# Patient Record
Sex: Female | Born: 1990 | Race: Black or African American | Hispanic: No | Marital: Married | Smoking: Never smoker
Health system: Southern US, Community
[De-identification: ages and names within clinical notes are randomized; demographics above are authoritative.]

## PROBLEM LIST (undated history)

## (undated) DIAGNOSIS — Z789 Other specified health status: Secondary | ICD-10-CM

---

## 2017-05-17 NOTE — L&D Delivery Note (Signed)
Delivery Note  First Stage: Labor onset: 0600 Augmentation : [facility] /[facility] intrapartum: Nubain 10mg  [facility] 1500 after # 2 dose ABX (4-5 cm/ BBOW / show)  Second Stage: Complete dilation at 0522 Onset of pushing at 0522 FHR second stage variable to 80-90 from 120-130 baseline  Delivery of a viable female at [id number] by CNM in LOA position Loose  nuchal cord x 1 Cord double clamped after cessation of pulsation, cut by FOB Cord blood sample collected   Third Stage: [facility] intact with 3 VC @ 0532 Placenta disposition: hospital disposal Uterine tone firm / bleeding scant  no laceration identified  Est. Blood Loss (mL): 50  Complications: none  Mom to postpartum.  Baby to Couplet care / Skin to Skin.  Newborn: Birth Weight: 6-3  Apgar Scores: 1-minute: 8                           5-minute: 9   Feeding planned: breast  Ruth Allen CNM, MSN, [facility] 12/06/2017, 5:43 AM

## 2017-05-26 LAB — OB RESULTS CONSOLE ABO/RH: RH Type: POSITIVE

## 2017-05-26 LAB — OB RESULTS CONSOLE RUBELLA ANTIBODY, IGM: Rubella: IMMUNE

## 2017-05-26 LAB — OB RESULTS CONSOLE GC/CHLAMYDIA
CHLAMYDIA, DNA PROBE: NEGATIVE
Gonorrhea: NEGATIVE

## 2017-05-26 LAB — OB RESULTS CONSOLE HEPATITIS B SURFACE ANTIGEN: HEP B S AG: NEGATIVE

## 2017-05-26 LAB — OB RESULTS CONSOLE ANTIBODY SCREEN: ANTIBODY SCREEN: NEGATIVE

## 2017-05-26 LAB — OB RESULTS CONSOLE HIV ANTIBODY (ROUTINE TESTING): HIV: NONREACTIVE

## 2017-05-26 LAB — OB RESULTS CONSOLE RPR: RPR: NONREACTIVE

## 2017-06-06 ENCOUNTER — Other Ambulatory Visit (HOSPITAL_COMMUNITY): Payer: Self-pay | Admitting: Certified Nurse Midwife

## 2017-06-06 DIAGNOSIS — Z3A19 19 weeks gestation of pregnancy: Secondary | ICD-10-CM

## 2017-06-06 DIAGNOSIS — Z3689 Encounter for other specified antenatal screening: Secondary | ICD-10-CM

## 2017-07-11 ENCOUNTER — Encounter (HOSPITAL_COMMUNITY): Payer: Self-pay | Admitting: Certified Nurse Midwife

## 2017-07-15 ENCOUNTER — Ambulatory Visit (HOSPITAL_COMMUNITY)
Admission: RE | Admit: 2017-07-15 | Discharge: 2017-07-15 | Disposition: A | Discharge status: Home / Self Care | Payer: Medicaid Other | Source: Ambulatory Visit | Attending: Certified Nurse Midwife | Admitting: Certified Nurse Midwife

## 2017-07-15 DIAGNOSIS — Z3689 Encounter for other specified antenatal screening: Secondary | ICD-10-CM

## 2017-07-15 DIAGNOSIS — Z3A19 19 weeks gestation of pregnancy: Secondary | ICD-10-CM | POA: Diagnosis not present

## 2017-07-21 ENCOUNTER — Other Ambulatory Visit (HOSPITAL_COMMUNITY): Payer: Self-pay

## 2017-07-21 DIAGNOSIS — Z362 Encounter for other antenatal screening follow-up: Secondary | ICD-10-CM

## 2017-07-21 DIAGNOSIS — Z3A23 23 weeks gestation of pregnancy: Secondary | ICD-10-CM

## 2017-08-12 ENCOUNTER — Ambulatory Visit (HOSPITAL_COMMUNITY): Payer: Medicaid Other

## 2017-08-17 ENCOUNTER — Ambulatory Visit (HOSPITAL_COMMUNITY): Payer: Medicaid Other

## 2017-08-18 ENCOUNTER — Ambulatory Visit (HOSPITAL_COMMUNITY)
Admission: RE | Admit: 2017-08-18 | Discharge: 2017-08-18 | Disposition: A | Discharge status: Home / Self Care | Payer: Medicaid Other | Source: Ambulatory Visit

## 2017-08-18 DIAGNOSIS — Z362 Encounter for other antenatal screening follow-up: Secondary | ICD-10-CM | POA: Diagnosis present

## 2017-08-18 DIAGNOSIS — Z3A23 23 weeks gestation of pregnancy: Secondary | ICD-10-CM

## 2017-08-19 ENCOUNTER — Other Ambulatory Visit: Payer: Self-pay

## 2017-08-19 ENCOUNTER — Emergency Department (HOSPITAL_COMMUNITY)
Admission: EM | Admit: 2017-08-19 | Discharge: 2017-08-19 | Disposition: A | Discharge status: Home / Self Care | Payer: Medicaid Other | Attending: Emergency Medicine | Admitting: Emergency Medicine

## 2017-08-19 ENCOUNTER — Encounter (HOSPITAL_COMMUNITY): Payer: Self-pay | Admitting: *Deleted

## 2017-08-19 DIAGNOSIS — K047 Periapical abscess without sinus: Secondary | ICD-10-CM

## 2017-08-19 DIAGNOSIS — K029 Dental caries, unspecified: Secondary | ICD-10-CM | POA: Diagnosis not present

## 2017-08-19 DIAGNOSIS — K0889 Other specified disorders of teeth and supporting structures: Secondary | ICD-10-CM | POA: Diagnosis present

## 2017-08-19 MED ORDER — HYDROCODONE-ACETAMINOPHEN 5-325 MG PO TABS
1.0000 | ORAL_TABLET | Freq: Once | ORAL | Status: AC
  Administered 2017-08-19: 1 via ORAL
  Filled 2017-08-19: qty 1

## 2017-08-19 MED ORDER — AMOXICILLIN 500 MG PO CAPS: 500.0000 mg | ORAL_CAPSULE | Freq: Three times a day (TID) | ORAL | 0 refills | Status: DC

## 2017-08-19 MED ORDER — AMOXICILLIN 500 MG PO CAPS
500.0000 mg | ORAL_CAPSULE | Freq: Once | ORAL | Status: AC
  Administered 2017-08-19: 500 mg via ORAL
  Filled 2017-08-19: qty 1

## 2017-08-19 NOTE — Discharge Instructions (Addendum)
Call Dr. Olen PelHalligan's office to schedule follow up as soon as possible. Tell them you were seen in the ED and referred.

## 2017-08-19 NOTE — ED Provider Notes (Signed)
[facility] Provider Note   CSN: [id number] Arrival date & time: 08/19/17  [id number]     History   Chief Complaint Chief Complaint  Patient presents with  . Dental Pain    HPI Ruth Allen is a 27 y.o. female G2P1 @ 2452w6d gestation who presents to the ED with dental pain. The pain is located in the left lower dental area and has gotten worse over the past 2 days. Patient has no problems with her pregnancy and is getting prenatal care.  HPI  History reviewed. No pertinent past medical history.  There are no active problems to display for this patient.   History reviewed. No pertinent surgical history.   OB History    Gravida  1   Para      Term      Preterm      AB      Living        SAB      TAB      Ectopic      Multiple      Live Births               Home Medications    Prior to Admission medications   Medication Sig Start Date End Date Taking? Authorizing Provider  amoxicillin (AMOXIL) 500 MG capsule Take 1 capsule (500 mg total) by mouth 3 (three) times daily. 08/19/17   Janne NapoleonNeese, Pessy Delamar M, NP    Family History No family history on file.  Social History Social History   Tobacco Use  . Smoking status: Never Smoker  . Smokeless tobacco: Never Used  Substance Use Topics  . Alcohol use: Never    Frequency: Never  . Drug use: Never     Allergies   Patient has no known allergies.   Review of Systems Review of Systems  Constitutional: Negative for chills and fever.  HENT: Positive for dental problem.   Respiratory: Negative for cough and shortness of breath.   Gastrointestinal: Negative for abdominal pain.  Genitourinary: Negative for vaginal bleeding and vaginal discharge.       23 weeks 6 days gestation  Musculoskeletal: Negative for arthralgias.  Skin: Negative for rash.  Neurological: Negative for headaches.  Psychiatric/Behavioral: Negative for confusion.     Physical Exam Updated Vital  Signs BP 111/72 (BP Location: Right Arm)   Pulse 80   Temp 98.3 F (36.8 C) (Oral)   Resp 18   LMP 03/02/2017 (Approximate)   SpO2 100%   Physical Exam  Constitutional: She is oriented to person, place, and time. She appears well-developed and well-nourished. No distress.  HENT:  Head: Normocephalic and atraumatic.  Mouth/Throat: Uvula is midline and oropharynx is clear and moist. Dental caries present.    Gum surrounding the left lower molars with erythema  Eyes: EOM are normal.  Neck: Neck supple.  Cardiovascular: Normal rate.  Pulmonary/Chest: Effort normal.  Abdominal: Soft. There is no tenderness.  Gravid c/w dates, positive FHT's by doppler  Musculoskeletal: Normal range of motion.  Neurological: She is alert and oriented to person, place, and time. No cranial nerve deficit.  Skin: Skin is warm and dry.  Psychiatric: She has a normal mood and affect. Her behavior is normal.  Nursing note and vitals reviewed.    ED Treatments / Results  Labs (all labs ordered are listed, but only abnormal results are displayed) Labs Reviewed - No data to display Procedures Procedures (including critical care time)  Medications Ordered  in ED Medications  amoxicillin (AMOXIL) capsule 500 mg (has no administration in time range)  HYDROcodone-acetaminophen (NORCO/VICODIN) 5-325 MG per tablet 1 tablet (has no administration in time range)     Initial Impression / Assessment and Plan / ED Course  I have reviewed the triage vital signs and the nursing notes. Patient with toothache in pregnancy @ 24.6 weeks  No gross abscess.  Exam unconcerning for Ludwig's angina or spread of infection.  Will treat with Amoxicillin and tylenol.  Urged patient to follow-up with dentist. Referral given.   Final Clinical Impressions(s) / ED Diagnoses   Final diagnoses:  Infected dental caries    ED Discharge Orders        Ordered    amoxicillin (AMOXIL) 500 MG capsule  3 times daily     08/19/17  1024       Damian Leavell [place], [place] 08/19/17 1043    Tegeler, Canary Brim, MD 08/19/17 1840

## 2017-08-19 NOTE — ED Triage Notes (Signed)
Pt is here with left lower molar tooth pain that started 2 days ago.  Pt is currently [redacted] weeks pregnant and has no complaints of abdominal pain, vaginal bleeding or concern about pregnancy.  G-2 P-1 A-0.  She just wants to make sure the tooth pain does not go to baby

## 2017-12-06 ENCOUNTER — Inpatient Hospital Stay (HOSPITAL_COMMUNITY)
Admission: AD | Admit: 2017-12-06 | Discharge: 2017-12-07 | DRG: 807 | Disposition: A | Discharge status: Home / Self Care | Payer: Medicaid Other | Attending: Obstetrics and Gynecology | Admitting: Obstetrics and Gynecology

## 2017-12-06 ENCOUNTER — Encounter (HOSPITAL_COMMUNITY): Payer: Self-pay

## 2017-12-06 DIAGNOSIS — Z3A39 39 weeks gestation of pregnancy: Secondary | ICD-10-CM | POA: Diagnosis not present

## 2017-12-06 DIAGNOSIS — Z3483 Encounter for supervision of other normal pregnancy, third trimester: Secondary | ICD-10-CM | POA: Diagnosis present

## 2017-12-06 DIAGNOSIS — O99824 Streptococcus B carrier state complicating childbirth: Principal | ICD-10-CM | POA: Diagnosis present

## 2017-12-06 HISTORY — DX: Other specified health status: Z78.9

## 2017-12-06 LAB — CBC
HCT: 34.5 % — ABNORMAL LOW (ref 36.0–46.0)
Hemoglobin: 12.4 g/dL (ref 12.0–15.0)
MCH: 33.9 pg (ref 26.0–34.0)
MCHC: 35.9 g/dL (ref 30.0–36.0)
MCV: 94.3 fL (ref 78.0–100.0)
Platelets: 197 10*3/uL (ref 150–400)
RBC: 3.66 MIL/uL — ABNORMAL LOW (ref 3.87–5.11)
RDW: 13 % (ref 11.5–15.5)
WBC: 9.1 10*3/uL (ref 4.0–10.5)

## 2017-12-06 MED ORDER — EPHEDRINE 5 MG/ML INJ
10.0000 mg | INTRAVENOUS | Status: DC | PRN
  Filled 2017-12-06: qty 2

## 2017-12-06 MED ORDER — DIBUCAINE 1 % RE OINT: 1.0000 "application " | TOPICAL_OINTMENT | RECTAL | Status: DC | PRN

## 2017-12-06 MED ORDER — OXYCODONE-ACETAMINOPHEN 5-325 MG PO TABS: 1.0000 | ORAL_TABLET | ORAL | Status: DC | PRN

## 2017-12-06 MED ORDER — ACETAMINOPHEN 325 MG PO TABS: 650.0000 mg | ORAL_TABLET | ORAL | Status: DC | PRN

## 2017-12-06 MED ORDER — OXYTOCIN 40 UNITS IN LACTATED RINGERS INFUSION - SIMPLE MED
1.0000 m[IU]/min | INTRAVENOUS | Status: DC
  Administered 2017-12-06: 2 m[IU]/min via INTRAVENOUS
  Filled 2017-12-06: qty 1000

## 2017-12-06 MED ORDER — COCONUT OIL OIL: 1.0000 "application " | TOPICAL_OIL | Status: DC | PRN

## 2017-12-06 MED ORDER — SOD CITRATE-CITRIC ACID 500-334 MG/5ML PO SOLN: 30.0000 mL | ORAL | Status: DC | PRN

## 2017-12-06 MED ORDER — PHENYLEPHRINE 40 MCG/ML (10ML) SYRINGE FOR IV PUSH (FOR BLOOD PRESSURE SUPPORT)
80.0000 ug | PREFILLED_SYRINGE | INTRAVENOUS | Status: DC | PRN
Start: 2017-12-06 — End: 2017-12-06
  Filled 2017-12-06: qty 5

## 2017-12-06 MED ORDER — OXYTOCIN BOLUS FROM INFUSION
500.0000 mL | Freq: Once | INTRAVENOUS | Status: AC
  Administered 2017-12-06: 500 mL via INTRAVENOUS

## 2017-12-06 MED ORDER — NALBUPHINE HCL 10 MG/ML IJ SOLN
10.0000 mg | INTRAMUSCULAR | Status: DC | PRN
  Administered 2017-12-06: 10 mg via INTRAVENOUS
  Filled 2017-12-06: qty 1

## 2017-12-06 MED ORDER — ACETAMINOPHEN 325 MG PO TABS
650.0000 mg | ORAL_TABLET | ORAL | Status: DC | PRN
  Administered 2017-12-06: 650 mg via ORAL
  Filled 2017-12-06: qty 2

## 2017-12-06 MED ORDER — TERBUTALINE SULFATE 1 MG/ML IJ SOLN
0.2500 mg | Freq: Once | INTRAMUSCULAR | Status: DC | PRN
  Filled 2017-12-06: qty 1

## 2017-12-06 MED ORDER — LACTATED RINGERS IV SOLN
INTRAVENOUS | Status: DC
  Administered 2017-12-06: 04:00:00 via INTRAVENOUS

## 2017-12-06 MED ORDER — DIPHENHYDRAMINE HCL 50 MG/ML IJ SOLN: 12.5000 mg | INTRAMUSCULAR | Status: DC | PRN

## 2017-12-06 MED ORDER — WITCH HAZEL-GLYCERIN EX PADS: 1.0000 "application " | MEDICATED_PAD | CUTANEOUS | Status: DC | PRN

## 2017-12-06 MED ORDER — LACTATED RINGERS IV SOLN: 500.0000 mL | Freq: Once | INTRAVENOUS | Status: DC

## 2017-12-06 MED ORDER — LIDOCAINE HCL (PF) 1 % IJ SOLN
30.0000 mL | INTRAMUSCULAR | Status: DC | PRN
  Filled 2017-12-06: qty 30

## 2017-12-06 MED ORDER — IBUPROFEN 600 MG PO TABS
600.0000 mg | ORAL_TABLET | Freq: Four times a day (QID) | ORAL | Status: DC
  Administered 2017-12-06 – 2017-12-07 (×5): 600 mg via ORAL
  Filled 2017-12-06 (×6): qty 1

## 2017-12-06 MED ORDER — BENZOCAINE-MENTHOL 20-0.5 % EX AERO
1.0000 "application " | INHALATION_SPRAY | CUTANEOUS | Status: DC | PRN
  Administered 2017-12-06: 1 via TOPICAL
  Filled 2017-12-06: qty 56

## 2017-12-06 MED ORDER — FENTANYL 2.5 MCG/ML BUPIVACAINE 1/10 % EPIDURAL INFUSION (WH - ANES): 14.0000 mL/h | INTRAMUSCULAR | Status: DC | PRN

## 2017-12-06 MED ORDER — SENNOSIDES-DOCUSATE SODIUM 8.6-50 MG PO TABS
2.0000 | ORAL_TABLET | ORAL | Status: DC
  Administered 2017-12-07: 2 via ORAL
  Filled 2017-12-06: qty 2

## 2017-12-06 MED ORDER — LACTATED RINGERS IV SOLN: 500.0000 mL | INTRAVENOUS | Status: DC | PRN

## 2017-12-06 MED ORDER — OXYCODONE-ACETAMINOPHEN 5-325 MG PO TABS: 2.0000 | ORAL_TABLET | ORAL | Status: DC | PRN

## 2017-12-06 MED ORDER — OXYTOCIN 40 UNITS IN LACTATED RINGERS INFUSION - SIMPLE MED: 2.5000 [IU]/h | INTRAVENOUS | Status: DC

## 2017-12-06 MED ORDER — PHENYLEPHRINE 40 MCG/ML (10ML) SYRINGE FOR IV PUSH (FOR BLOOD PRESSURE SUPPORT)
80.0000 ug | PREFILLED_SYRINGE | INTRAVENOUS | Status: DC | PRN
  Filled 2017-12-06: qty 5

## 2017-12-06 MED ORDER — SIMETHICONE 80 MG PO CHEW: 80.0000 mg | CHEWABLE_TABLET | ORAL | Status: DC | PRN

## 2017-12-06 NOTE — Lactation Note (Signed)
This note was copied from a baby's chart. Lactation Consultation Note  Patient Name: Boy Janeece Fittingykia Koning NWGNF'AToday's Date: 12/06/2017 Reason for consult: Initial assessment  Visited with P2 Mom of term baby at 567 hrs old.  Mom pumped and bottle fed her 1st child for 6 months.  This baby has latched on and breastfed (LS-9 per L&D RN) 3 times in 7 hrs.  Baby dressed in clothes.  Talked about the benefit of keeping baby STS on her chest between feedings.  Mom describes some pain with baby's latching.  Mom has small breasts that are widely spaced.  Mom had + breast changes with this pregnancy.  Mom states milk supply was adequate for normal growth of 1st baby, with exclusive pumping for 4 months, and then supplemented with formula for 2 more months.  Mom desires to breastfeed longer with this baby, and plans to stay home with her children.  Encouraged STS, and feeding baby often when he cues. Mom to call for latch assist/assess from her nurse/LC at next feeding.   Lactation brochure given and Mom aware of Lactation support available IP and OP.  Interventions Interventions: Breast feeding basics reviewed;Skin to skin;Breast massage;Hand express;Hand pump  Lactation Tools Discussed/Used WIC Program: No Pump Review: Setup, frequency, and cleaning;Milk Storage Initiated by:: Erby Pian Shepherd Finnan RN IBCLC Date initiated:: 12/06/17   Consult Status Consult Status: Follow-up Date: 12/07/17 Follow-up type: In-patient    Judee ClaraSmith, Crosley Stejskal E 12/06/2017, 1:08 PM

## 2017-12-06 NOTE — H&P (Signed)
  OB ADMISSION/ HISTORY & PHYSICAL:  Admission Date: 12/06/2017  3:06 AM  Admit Diagnosis: protracted labor  Ruth Allen is a 27 y.o. female presenting for augmentation of labor. GBS positive - ROM at 1500 [facility] (Amp[facility] per CDC guidelines)  Prenatal History: G2 P1 EDC :  [facility] Provider: Thresa RossBailey & Allen CNM Prenatal course complicated by (+) GBS  Prenatal Labs: ABO, Rh:  O pos Antibody:  negative Rubella:   Immune RPR:   NR HBsAg:   Negative HIV:   NR GTT: negative GBS:   positive  Medical / Surgical History :  Past medical history: No past medical history on file.   Past surgical history: No past surgical history on file.  Family History: No family history on file.   Social History:  reports that she has never smoked. She has never used smokeless tobacco. She reports that she does not drink alcohol or use drugs.  Allergies: Patient has no known allergies.   Current Medications at time of admission:  Prenatal vitamin  Review of Systems: Active FM onset of ctx 0600 yesterday - hx precipitous labor with last delivery <4 hours LOF  / AROM @ 1500 bloody show present   Physical Exam: VS: Last menstrual period 03/02/2017.  General: alert and oriented, appears uncomfortable Heart: RRR Lungs: Clear lung fields Abdomen: Gravid, soft and non-tender, non-distended / uterus: gravid with EFW ~ 7 1/2 -8 pounds Extremities: no edema  Genitalia / VE:    FHR: baseline rate 145 / variability moderate / accelerations 10x10 / no decelerations TOCO: ctx every 3-7 minutes  Assessment: [redacted] weeks gestation active stage of labor FHR category 1   Plan:  Admit Pitocin augmentation SVD  Dr Billy Coastaavon notified of admission / plan of care   Marlinda Mikeanya Alishah Schulte CNM, MSN, [facility] 12/06/2017, 3:14 AM

## 2017-12-07 ENCOUNTER — Encounter (HOSPITAL_COMMUNITY): Payer: Self-pay | Admitting: *Deleted

## 2017-12-07 MED ORDER — ACETAMINOPHEN 325 MG PO TABS: 650.0000 mg | ORAL_TABLET | ORAL | Status: AC | PRN

## 2017-12-07 MED ORDER — IBUPROFEN 600 MG PO TABS: 600.0000 mg | ORAL_TABLET | Freq: Four times a day (QID) | ORAL | 0 refills | Status: DC

## 2017-12-07 MED ORDER — BENZOCAINE-MENTHOL 20-0.5 % EX AERO: 1.0000 "application " | INHALATION_SPRAY | CUTANEOUS | Status: AC | PRN

## 2017-12-07 MED ORDER — COCONUT OIL OIL: 1.0000 "application " | TOPICAL_OIL | 0 refills | Status: AC | PRN

## 2017-12-07 NOTE — Progress Notes (Signed)
PPD # 1 S/P NSVD Information for the patient's newborn:  Roxan HockeyCole, Boy Vennesa [[id number]][[id number]]  female   [facility] Baby name: Iran SizerNoble Feeding: breast and bottle Mother requesting donor milk. Phototherapy - hyperbili/ABO incompatibility  S:  Reports feeling well.             Tolerating po/ No nausea or vomiting             Bleeding is light             Pain controlled with ibuprofen (OTC)             Up ad lib / ambulatory / voiding without difficulties   O:  A & O x 3, in no apparent distress              VS:  Vitals:   12/06/17 0900 12/06/17 1320 12/06/17 2100 12/07/17 0533  BP: 121/81 105/69 105/62 131/76  Pulse: 68 66 62 63  Resp: 18 18 16 16   Temp: 98.1 F (36.7 C) 98.4 F (36.9 C) 98.2 F (36.8 C)   TempSrc: Oral Oral    SpO2: 100% 100%    Weight:      Height:        LABS:  Recent Labs    12/06/17 0615  WBC 9.1  HGB 12.4  HCT 34.5*  PLT 197    Blood type: O/Positive/-- (01/10 0000)  Rubella: Immune (01/10 0000)   I&O: I/O last 3 completed shifts: In: -  Out: 450 [Urine:400; Blood:50]          No intake/output data recorded.  Lungs: Clear and unlabored  Heart: regular rate and rhythm / no murmurs  Abdomen: soft, non-tender, non-distended             Fundus: firm, non-tender, U-1  Perineum: intact  Lochia: small  Extremities: no edema, no calf pain or tenderness    A/P: PPD # 1 26 y.o., G1P1001   Active Problems:   SVD 7/23   Postpartum care following vaginal delivery   Doing well - stable status  Routine post partum orders  DC to boarding status today   [facility]     Neta Mendsaniela C Shervon Kerwin, MSN, CNM 12/07/2017, 8:55 AM

## 2017-12-07 NOTE — Discharge Summary (Signed)
Obstetric Discharge Summary Reason for Admission: latent labor, [facility] Prenatal Procedures: ultrasound Intrapartum Procedures: spontaneous vaginal delivery, Nubain IV for pain control Postpartum Procedures: none Complications-Operative and Postpartum: none Hemoglobin  Date Value Ref Range Status  12/06/2017 12.4 12.0 - 15.0 g/dL Final   HCT  Date Value Ref Range Status  12/06/2017 34.5 (L) 36.0 - 46.0 % Final    Physical Exam:  General: alert, cooperative and no distress Lochia: appropriate Uterine Fundus: firm Incision: NA DVT Evaluation: No cords or calf tenderness. No significant calf/ankle edema.  Discharge Diagnoses:  Patient Active Problem List   Diagnosis Date Noted  . SVD 7/23 12/06/2017    Priority: Medium  . Postpartum care following vaginal delivery 12/06/2017     Discharge Information: Date: 12/07/2017 Activity: pelvic rest Diet: routine Medications:  Allergies as of 12/07/2017   No Known Allergies     Medication List    TAKE these medications   acetaminophen 325 MG tablet Commonly known as:  TYLENOL Take 2 tablets (650 mg total) by mouth every 4 (four) hours as needed (for pain scale < 4ORtemperature>/=100.5 F).   benzocaine-Menthol 20-0.5 % Aero Commonly known as:  DERMOPLAST Apply 1 application topically as needed for irritation (perineal discomfort).   coconut oil Oil Apply 1 application topically as needed.   ibuprofen 600 MG tablet Commonly known as:  ADVIL,MOTRIN Take 1 tablet (600 mg total) by mouth every 6 (six) hours.   prenatal multivitamin Tabs tablet Take 1 tablet by mouth daily at 12 noon.            Discharge Care Instructions  (From admission, onward)        Start     Ordered   12/07/17 0000  Discharge wound care:    Comments:  Sitz baths 2 times /day with warm water x 1 week   12/07/17 [id number]     Condition: stable Instructions: Mommy & Me booklet Discharge to:  boarding status Follow-up Information    Neta Mendsaul, Charae Depaolis C, CNM. Schedule an appointment as soon as possible for a visit in 2 week(s).   Specialty:  Obstetrics and Gynecology Contact information: [address] [place] [address] [phone]           Newborn Data: Live born female  Birth Weight: 6 lb 3 oz (2807 g) APGAR: 8, 9  Newborn Delivery   Birth date/time:  12/06/2017 05:27:00 Delivery type:  Vaginal, Spontaneous     Remains inpatient for phototherapy  Neta MendsDaniela C Ladamien Rammel, CNM 12/07/2017, 9:18 AM

## 2018-07-14 IMAGING — US US MFM OB COMP +14 WKS
1 series · 14 of 28 positions shown · non-contrast
Comparison: none

[Series 1: us mfm ob comp +14 wks · 14 of 70 slices shown]
[im 3/70]
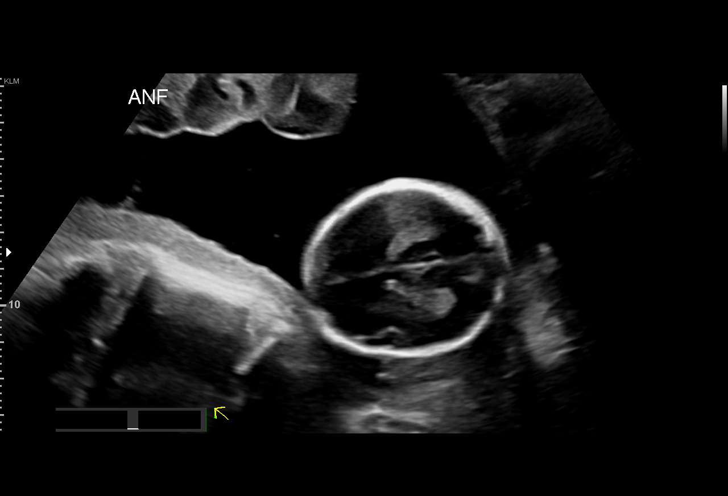
[im 8/70]
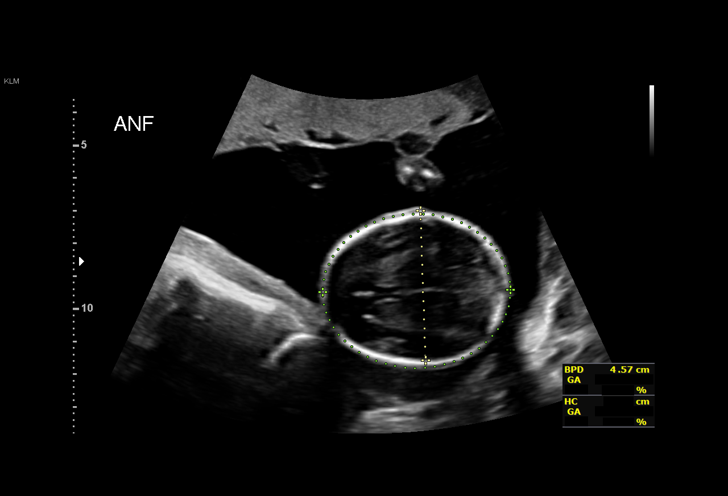
[im 13/70]
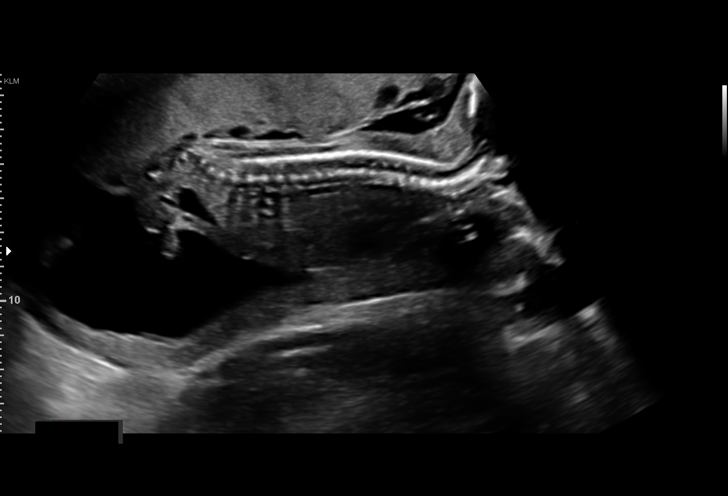
[im 18/70]
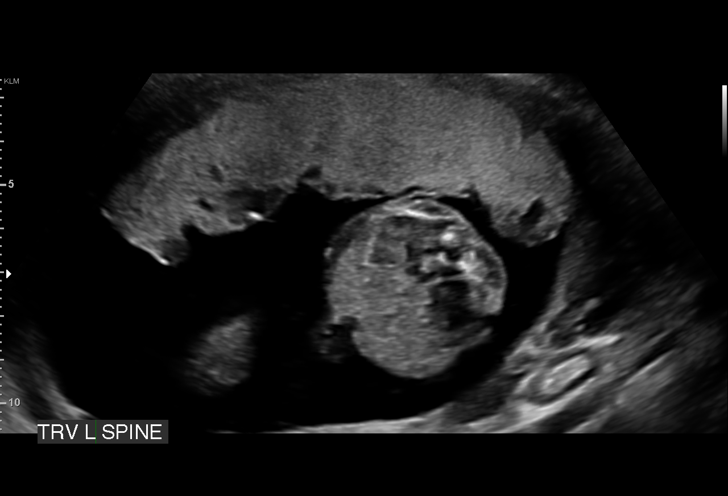
[im 24/70]
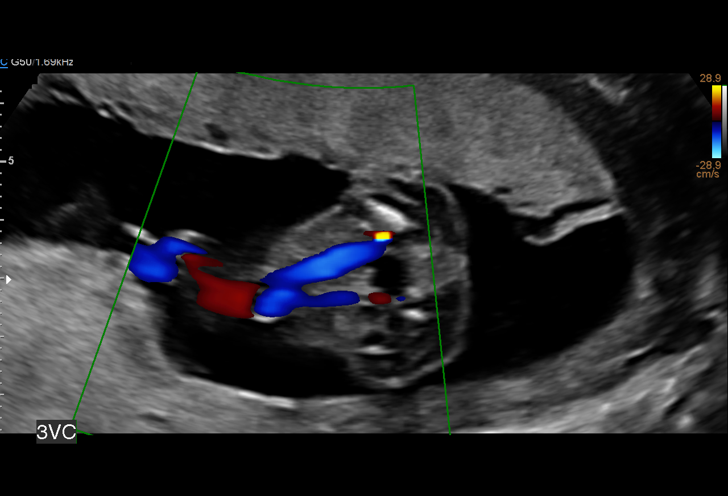
[im 29/70]
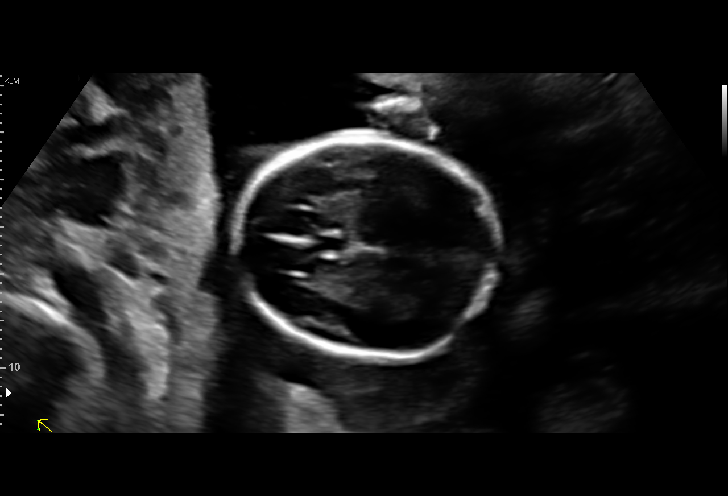
[im 34/70]
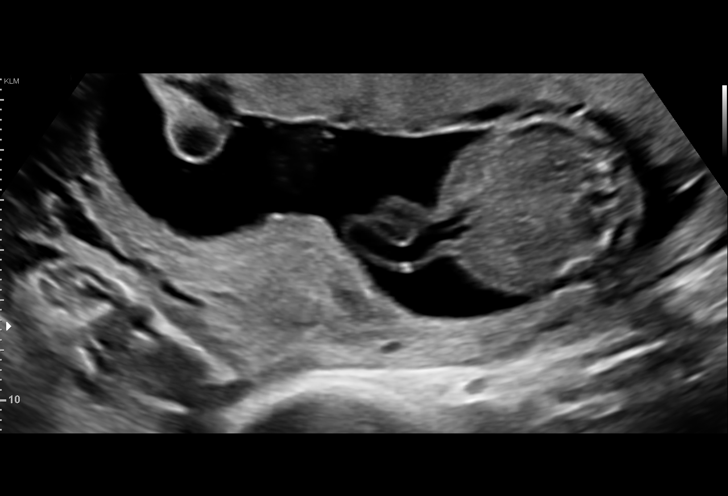
[im 39/70]
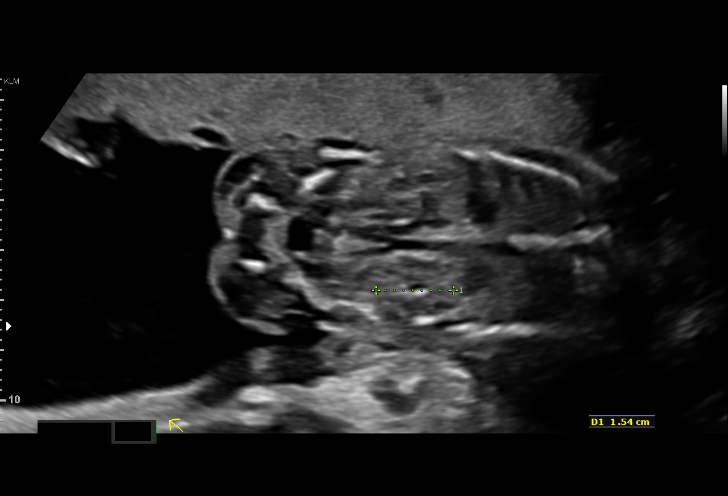
[im 44/70]
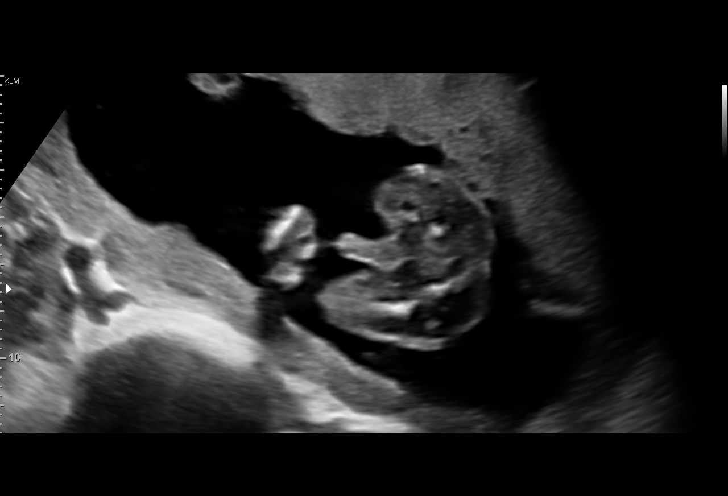
[im 49/70]
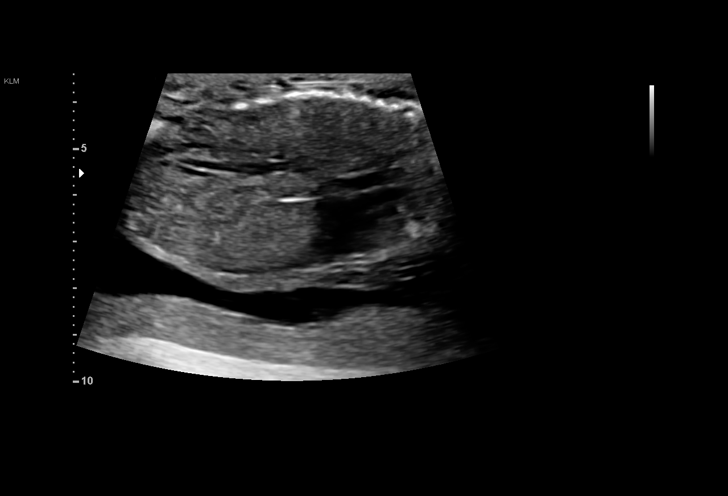
[im 54/70]
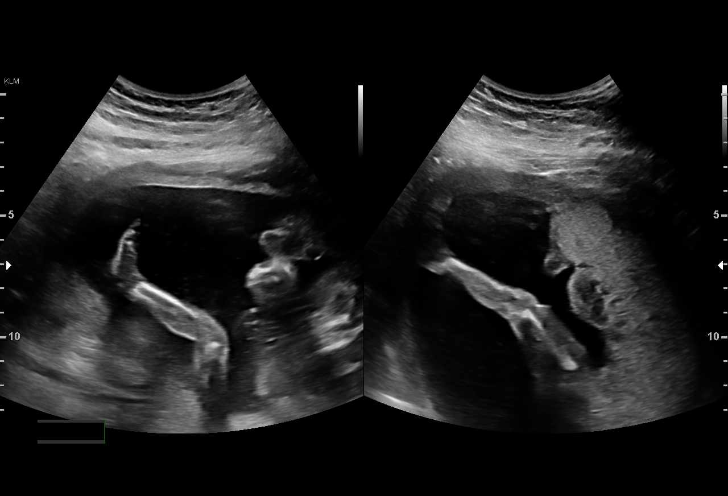
[im 59/70]
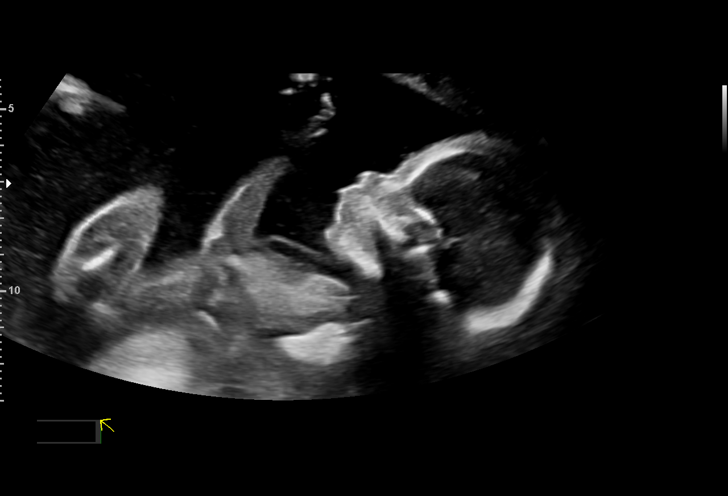
[im 64/70]
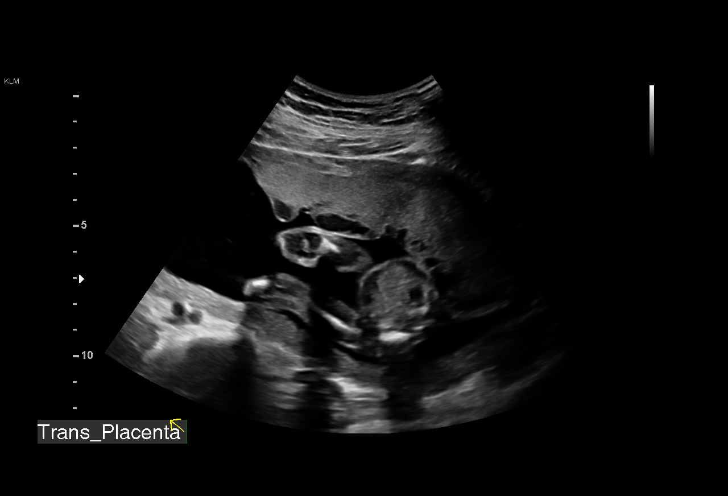
[im 70/70]
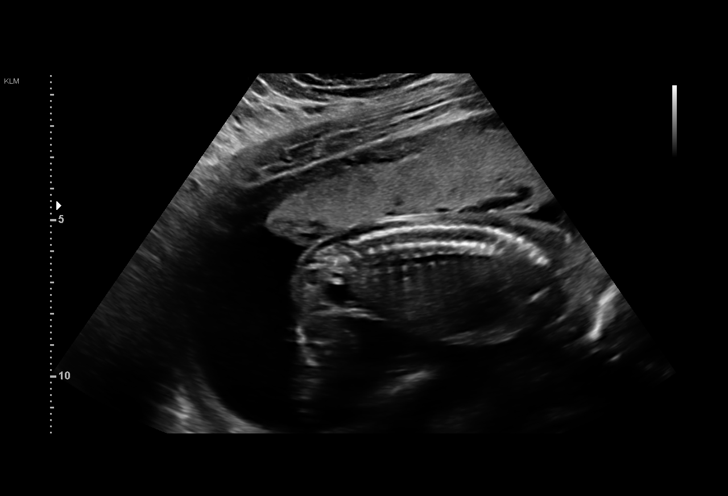

[14 of 28 positions shown; findings below may reference images not displayed]

Center

1  TALYAT SUZUKI             [id number]      [id number]     [id number]
Indications

19 weeks gestation of pregnancy
Encounter for fetal anatomic survey
Fetal Evaluation

Num Of Fetuses:     1
Fetal Heart         148
Rate(bpm):
Cardiac Activity:   Observed
Presentation:       Cephalic
Placenta:           Anterior, above cervical os
P. Cord Insertion:  Visualized

Amniotic Fluid
AFI FV:      Subjectively within normal limits
Biometry

BPD:      45.7  mm     G. Age:  19w 6d         82  %    CI:        75.72   %    70 - 86
FL/HC:      20.5   %    16.1 -
HC:      166.5  mm     G. Age:  19w 2d         59  %    HC/AC:      1.14        1.09 -
AC:      145.8  mm     G. Age:  19w 6d         75  %    FL/BPD:     74.6   %
FL:       34.1  mm     G. Age:  20w 5d         92  %    FL/AC:      23.4   %    20 - 24
CER:      20.4  mm     G. Age:  19w 3d         59  %
NFT:       4.5  mm
CM:        2.8  mm

Est. FW:     336  gm    0 lb 12 oz      62  %
Gestational Age

LMP:           19w 0d        Date:  03/04/17                 EDD:   12/09/17
U/S Today:     20w 0d                                        EDD:   12/02/17
Best:          19w 0d     Det. By:  LMP  (03/04/17)          EDD:   12/09/17
Anatomy

Cranium:               Appears normal         Aortic Arch:            Not well visualized
Cavum:                 Appears normal         Ductal Arch:            Not well visualized
Ventricles:            Appears normal         Diaphragm:              Appears normal
Choroid Plexus:        Appears normal         Stomach:                Appears normal, left
sided
Cerebellum:            Appears normal         Abdomen:                Appears normal
Posterior Fossa:       Appears normal         Abdominal Wall:         Appears nml (cord
insert, abd wall)
Nuchal Fold:           Appears normal         Cord Vessels:           Appears normal (3
vessel cord)
Face:                  Appears normal         Kidneys:                Appear normal
(orbits and profile)
Lips:                  Appears normal         Bladder:                Appears normal
Thoracic:              Appears normal         Spine:                  Appears normal
Heart:                 Not well visualized    Upper Extremities:      Visualized
RVOT:                  Not well visualized    Lower Extremities:      Appears normal
LVOT:                  Not well visualized

Other:  Fetus appears to be a male. Heels visualized. Nasal bone visualized.
Cervix Uterus Adnexa

Cervix
Length:            4.2  cm.
Normal appearance by transabdominal scan.
Impression

Single living intrauterine pregnancy at 19w 0d.
Placenta Anterior, above cervical os.
Appropriate fetal growth.
Normal amniotic fluid volume.
The fetal anatomic survey is not complete.
No gross fetal anomalies identified.
The cervix measures 4.2cm transabdominally without
funneling.
Recommendations

Recommend follow-up ultrasound examination in 4 weeks for
reassessment of fetal growth and anatomy.

## 2020-02-06 ENCOUNTER — Other Ambulatory Visit: Payer: Self-pay

## 2020-02-06 ENCOUNTER — Encounter (HOSPITAL_COMMUNITY): Payer: Self-pay | Admitting: Emergency Medicine

## 2020-02-06 ENCOUNTER — Emergency Department (HOSPITAL_COMMUNITY)
Admission: EM | Admit: 2020-02-06 | Discharge: 2020-02-06 | Disposition: A | Discharge status: Home / Self Care | Payer: HRSA Program | Attending: Emergency Medicine | Admitting: Emergency Medicine

## 2020-02-06 DIAGNOSIS — U071 COVID-19: Secondary | ICD-10-CM | POA: Insufficient documentation

## 2020-02-06 DIAGNOSIS — M791 Myalgia, unspecified site: Secondary | ICD-10-CM | POA: Diagnosis present

## 2020-02-06 LAB — SARS CORONAVIRUS 2 BY RT PCR (HOSPITAL ORDER, PERFORMED IN ~~LOC~~ HOSPITAL LAB): SARS Coronavirus 2: POSITIVE — AB

## 2020-02-06 NOTE — Discharge Instructions (Addendum)
Your COVID 19 test ispositive, please make sure to quarantine additional 7 to 10 days. You may take ibuprofen/Tylenol for body aches and fevers, drink plenty of fluids, over-the-counter cold and flu medications. You may also buy an over-the-counter pulse oximeter which can be found at your local pharmacy. If your oxygen saturation drops below 90%, please make sure to return to the ER. Return to the ER for any worsening shortness of breath.     Person Under Monitoring Name: Ruth Allen  Location: [address] [place] [address]   Infection Prevention Recommendations for Individuals Confirmed to have, or Being Evaluated for, 2019 Novel Coronavirus (COVID-19) Infection Who Receive Care at Home  Individuals who are confirmed to have, or are being evaluated for, COVID-19 should follow the prevention steps below until a healthcare provider or local or state health department says they can return to normal activities.  Stay home except to get medical care You should restrict activities outside your home, except for getting medical care. Do not go to work, school, or public areas, and do not use public transportation or taxis.  Call ahead before visiting your doctor Before your medical appointment, call the healthcare provider and tell them that you have, or are being evaluated for, COVID-19 infection. This will help the healthcare provider's office take steps to keep other people from getting infected. Ask your healthcare provider to call the local or state health department.  Monitor your symptoms Seek prompt medical attention if your illness is worsening (e.g., difficulty breathing). Before going to your medical appointment, call the healthcare provider and tell them that you have, or are being evaluated for, COVID-19 infection. Ask your healthcare provider to call the local or state health department.  Wear a facemask You should wear a facemask that covers your nose and  mouth when you are in the same room with other people and when you visit a healthcare provider. People who live with or visit you should also wear a facemask while they are in the same room with you.  Separate yourself from other people in your home As much as possible, you should stay in a different room from other people in your home. Also, you should use a separate bathroom, if available.  Avoid sharing household items You should not share dishes, drinking glasses, cups, eating utensils, towels, bedding, or other items with other people in your home. After using these items, you should wash them thoroughly with soap and water.  Cover your coughs and sneezes Cover your mouth and nose with a tissue when you cough or sneeze, or you can cough or sneeze into your sleeve. Throw used tissues in a lined trash can, and immediately wash your hands with soap and water for at least 20 seconds or use an alcohol-based hand rub.  Wash your [employer] your hands often and thoroughly with soap and water for at least 20 seconds. You can use an alcohol-based hand sanitizer if soap and water are not available and if your hands are not visibly dirty. Avoid touching your eyes, nose, and mouth with unwashed hands.   Prevention Steps for Caregivers and Household Members of Individuals Confirmed to have, or Being Evaluated for, COVID-19 Infection Being Cared for in the Home  If you live with, or provide care at home for, a person confirmed to have, or being evaluated for, COVID-19 infection please follow these guidelines to prevent infection:  Follow healthcare provider's instructions Make sure that you understand and can  help the patient follow any healthcare provider instructions for all care.  Provide for the patient's basic needs You should help the patient with basic needs in the home and provide support for getting groceries, prescriptions, and other personal needs.  Monitor the patient's  symptoms If they are getting sicker, call his or her medical provider and tell them that the patient has, or is being evaluated for, COVID-19 infection. This will help the healthcare provider's office take steps to keep other people from getting infected. Ask the healthcare provider to call the local or state health department.  Limit the number of people who have contact with the patient If possible, have only one caregiver for the patient. Other household members should stay in another home or place of residence. If this is not possible, they should stay in another room, or be separated from the patient as much as possible. Use a separate bathroom, if available. Restrict visitors who do not have an essential need to be in the home.  Keep older adults, very young children, and other sick people away from the patient Keep older adults, very young children, and those who have compromised immune systems or chronic health conditions away from the patient. This includes people with chronic heart, lung, or kidney conditions, diabetes, and cancer.  Ensure good ventilation Make sure that shared spaces in the home have good air flow, such as from an air conditioner or an opened window, weather permitting.  Wash your hands often Wash your hands often and thoroughly with soap and water for at least 20 seconds. You can use an alcohol based hand sanitizer if soap and water are not available and if your hands are not visibly dirty. Avoid touching your eyes, nose, and mouth with unwashed hands. Use disposable paper towels to dry your hands. If not available, use dedicated cloth towels and replace them when they become wet.  Wear a facemask and gloves Wear a disposable facemask at all times in the room and gloves when you touch or have contact with the patient's blood, body fluids, and/or secretions or excretions, such as sweat, saliva, sputum, nasal mucus, vomit, urine, or feces.  Ensure the mask fits over  your nose and mouth tightly, and do not touch it during use. Throw out disposable facemasks and gloves after using them. Do not reuse. Wash your hands immediately after removing your facemask and gloves. If your personal clothing becomes contaminated, carefully remove clothing and launder. Wash your hands after handling contaminated clothing. Place all used disposable facemasks, gloves, and other waste in a lined container before disposing them with other household waste. Remove gloves and wash your hands immediately after handling these items.  Do not share dishes, glasses, or other household items with the patient Avoid sharing household items. You should not share dishes, drinking glasses, cups, eating utensils, towels, bedding, or other items with a patient who is confirmed to have, or being evaluated for, COVID-19 infection. After the person uses these items, you should wash them thoroughly with soap and water.  Wash laundry thoroughly Immediately remove and wash clothes or bedding that have blood, body fluids, and/or secretions or excretions, such as sweat, saliva, sputum, nasal mucus, vomit, urine, or feces, on them. Wear gloves when handling laundry from the patient. Read and follow directions on labels of laundry or clothing items and detergent. In general, wash and dry with the warmest temperatures recommended on the label.  Clean all areas the individual has used often Clean all touchable  surfaces, such as counters, tabletops, doorknobs, bathroom fixtures, toilets, phones, keyboards, tablets, and bedside tables, every day. Also, clean any surfaces that may have blood, body fluids, and/or secretions or excretions on them. Wear gloves when cleaning surfaces the patient has come in contact with. Use a diluted bleach solution (e.g., dilute bleach with 1 part bleach and 10 parts water) or a household disinfectant with a label that says EPA-registered for coronaviruses. To make a bleach  solution at home, add 1 tablespoon of bleach to 1 quart (4 cups) of water. For a larger supply, add  cup of bleach to 1 gallon (16 cups) of water. Read labels of cleaning products and follow recommendations provided on product labels. Labels contain instructions for safe and effective use of the cleaning product including precautions you should take when applying the product, such as wearing gloves or eye protection and making sure you have good ventilation during use of the product. Remove gloves and wash hands immediately after cleaning.  Monitor yourself for signs and symptoms of illness Caregivers and household members are considered close contacts, should monitor their health, and will be asked to limit movement outside of the home to the extent possible. Follow the monitoring steps for close contacts listed on the symptom monitoring form.   ? If you have additional questions, contact your local health department or call the epidemiologist on call at [phone] (available 24/7). ? This guidance is subject to change. For the most up-to-date guidance from [facility], please refer to their website: [email or web address]

## 2020-02-06 NOTE — ED Triage Notes (Signed)
Pt reports sore throat and loss of taste since yesterday.  Headache x 3-4 days.  Requesting COVID testing and work note.

## 2020-02-06 NOTE — ED Provider Notes (Signed)
[facility] EMERGENCY DEPARTMENT Provider Note   CSN: [id number] Arrival date & time: 02/06/20  1251     History Chief Complaint  Patient presents with  . COVID testing    Ruth Allen is a 29 y.o. female.  HPI 29 year old female with no significant medical history presents to the ER with myalgias, loss of taste and smell, headache, backaches.  Reports dry nonproductive cough.  Some watering of the eyes.  He is not vaccinated.  Requesting Covid test.  Denies any sore throat, nausea, vomiting, abdominal pain, dysuria.  No known sick contacts.    Past Medical History:  Diagnosis Date  . Medical history non-contributory     Patient Active Problem List   Diagnosis Date Noted  . SVD 7/23 12/06/2017  . Postpartum care following vaginal delivery 12/06/2017    History reviewed. No pertinent surgical history.   OB History    Gravida  1   Para  1   Term  1   Preterm      AB      Living  1     SAB      TAB      Ectopic      Multiple  0   Live Births  1           No family history on file.  Social History   Tobacco Use  . Smoking status: Never Smoker  . Smokeless tobacco: Never Used  Substance Use Topics  . Alcohol use: Never  . Drug use: Never    Home Medications Prior to Admission medications   Medication Sig Start Date End Date Taking? Authorizing Provider  acetaminophen (TYLENOL) 325 MG tablet Take 2 tablets (650 mg total) by mouth every 4 (four) hours as needed (for pain scale < 4ORtemperature>/=100.5 F). 12/07/17   Neta Mends, CNM  benzocaine-Menthol (DERMOPLAST) 20-0.5 % AERO Apply 1 application topically as needed for irritation (perineal discomfort). 12/07/17   Neta Mends, CNM  coconut oil OIL Apply 1 application topically as needed. 12/07/17   Neta Mends, CNM  ibuprofen (ADVIL,MOTRIN) 600 MG tablet Take 1 tablet (600 mg total) by mouth every 6 (six) hours. 12/07/17   Neta Mends, CNM  Prenatal Vit-Fe  Fumarate-FA (PRENATAL MULTIVITAMIN) TABS tablet Take 1 tablet by mouth daily at 12 noon.    [provider]    Allergies    Patient has no known allergies.  Review of Systems   Review of Systems  Constitutional: Positive for activity change, appetite change, chills, fatigue and fever.  HENT: Positive for sore throat.   Eyes: Negative for visual disturbance.  Respiratory: Positive for cough and shortness of breath.   Cardiovascular: Negative for chest pain.  Gastrointestinal: Positive for diarrhea, nausea and vomiting. Negative for abdominal pain.  Genitourinary: Negative for dysuria.  Musculoskeletal: Positive for back pain.  Neurological: Positive for weakness.    Physical Exam Updated Vital Signs BP 107/72 (BP Location: Right Arm)   Pulse 80   Temp 98.8 F (37.1 C) (Oral)   Resp 18   Ht 6' (1.829 m)   Wt 95.3 kg   LMP 01/16/2020   SpO2 99%   BMI 28.48 kg/m   Physical Exam Vitals reviewed.  Constitutional:      General: She is not in acute distress.    Appearance: Normal appearance. She is not ill-appearing, toxic-appearing or diaphoretic.  HENT:     Head: Normocephalic and atraumatic.     Nose:  Nose normal.     Mouth/Throat:     Mouth: Mucous membranes are moist.     Pharynx: Oropharynx is clear. No oropharyngeal exudate or posterior oropharyngeal erythema.     Comments: Oropharynx non erythematous without exudates, uvula midline, no unilateral tonsillar swelling, tongue normal size and midline, no sublingual/submandibular swellimg, tolerating secretions well    Eyes:     General:        Right eye: No discharge.        Left eye: No discharge.     Extraocular Movements: Extraocular movements intact.     Conjunctiva/sclera: Conjunctivae normal.  Cardiovascular:     Rate and Rhythm: Normal rate and regular rhythm.     Pulses: Normal pulses.     Heart sounds: Normal heart sounds.  Pulmonary:     Effort: Pulmonary effort is normal.     Breath sounds:  Normal breath sounds. No wheezing or rhonchi.  Abdominal:     General: Abdomen is flat.     Tenderness: There is no abdominal tenderness.  Musculoskeletal:        General: No swelling. Normal range of motion.     Cervical back: Normal range of motion.     Comments: Tenderness, moving extremities without difficulty.  Skin:    General: Skin is warm and dry.     Capillary Refill: Capillary refill takes less than 2 seconds.     Findings: No erythema or rash.  Neurological:     General: No focal deficit present.     Mental Status: She is alert and oriented to person, place, and time.  Psychiatric:        Mood and Affect: Mood normal.        Behavior: Behavior normal.     ED Results / Procedures / Treatments   Labs (all labs ordered are listed, but only abnormal results are displayed) Labs Reviewed  SARS CORONAVIRUS 2 BY RT PCR (HOSPITAL ORDER, PERFORMED IN [facility]) - Abnormal; Notable for the following components:      Result Value   SARS Coronavirus 2 POSITIVE (*)    All other components within normal limits    EKG None  Radiology No results found.  Procedures Procedures (including critical care time)  Medications Ordered in ED Medications - No data to display  ED Course  I have reviewed the triage vital signs and the nursing notes.  Pertinent labs & imaging results that were available during my care of the patient were reviewed by me and considered in my medical decision making (see chart for details).    MDM Rules/Calculators/A&P                         Patient presents with Covid symptoms.  Low concern for cauda equina and intraspinal abscess, likely consistent with COVID-19.  Vitals are stable, oxygen saturations are 99%, patient is not tachypneic or tachycardic.  Complaints of chest pain or shortness of breath.  No visible exudates, erythema, exudates of peritonsillar/retropharyngeal abscess, Ludwig's angina.   COVID test positive. Patient educated  on supportive care.  Instructed her to buy a pulseox  and return to the ER if her oxygen saturations are below 90%.  Instructed to quarantine for 7 to 10 days.  She voiced understanding and is agreeable.  Return precautions discussed.  Remains hemodynamically stable at discharge.  Final Clinical Impression(s) / ED Diagnoses Final diagnoses:  COVID-19    Rx / DC Orders  ED Discharge Orders    None       Leone Brand 02/06/20 1549    Pricilla Loveless, MD 02/07/20 [phone]

## 2020-02-07 ENCOUNTER — Other Ambulatory Visit: Payer: Self-pay | Admitting: Family

## 2020-02-07 ENCOUNTER — Telehealth: Payer: Self-pay | Admitting: Family

## 2020-02-07 ENCOUNTER — Ambulatory Visit (HOSPITAL_COMMUNITY)
Admission: RE | Admit: 2020-02-07 | Discharge: 2020-02-07 | Disposition: A | Discharge status: Home / Self Care | Payer: Medicaid Other | Source: Ambulatory Visit | Attending: Pulmonary Disease | Admitting: Pulmonary Disease

## 2020-02-07 DIAGNOSIS — U071 COVID-19: Secondary | ICD-10-CM

## 2020-02-07 DIAGNOSIS — Z6828 Body mass index (BMI) 28.0-28.9, adult: Secondary | ICD-10-CM

## 2020-02-07 DIAGNOSIS — Z609 Problem related to social environment, unspecified: Secondary | ICD-10-CM | POA: Diagnosis present

## 2020-02-07 MED ORDER — FAMOTIDINE IN NACL 20-0.9 MG/50ML-% IV SOLN: 20.0000 mg | Freq: Once | INTRAVENOUS | Status: DC | PRN

## 2020-02-07 MED ORDER — EPINEPHRINE 0.3 MG/0.3ML IJ SOAJ: 0.3000 mg | Freq: Once | INTRAMUSCULAR | Status: DC | PRN

## 2020-02-07 MED ORDER — METHYLPREDNISOLONE SODIUM SUCC 125 MG IJ SOLR: 125.0000 mg | Freq: Once | INTRAMUSCULAR | Status: DC | PRN

## 2020-02-07 MED ORDER — SODIUM CHLORIDE 0.9 % IV SOLN: INTRAVENOUS | Status: DC | PRN

## 2020-02-07 MED ORDER — DIPHENHYDRAMINE HCL 50 MG/ML IJ SOLN: 50.0000 mg | Freq: Once | INTRAMUSCULAR | Status: DC | PRN

## 2020-02-07 MED ORDER — SODIUM CHLORIDE 0.9 % IV SOLN
1200.0000 mg | Freq: Once | INTRAVENOUS | Status: AC
  Administered 2020-02-07: 1200 mg via INTRAVENOUS

## 2020-02-07 MED ORDER — ALBUTEROL SULFATE HFA 108 (90 BASE) MCG/ACT IN AERS: 2.0000 | INHALATION_SPRAY | Freq: Once | RESPIRATORY_TRACT | Status: DC | PRN

## 2020-02-07 NOTE — Progress Notes (Signed)
I connected by phone with Ruth Allen on 02/07/2020 at 3:37 PM to discuss the potential use of a new treatment for mild to moderate COVID-19 viral infection in non-hospitalized patients.  This patient is a 29 y.o. female that meets the FDA criteria for Emergency Use Authorization of COVID monoclonal antibody casirivimab/imdevimab.  Has a (+) direct SARS-CoV-2 viral test result  Has mild or moderate COVID-19   Is NOT hospitalized due to COVID-19  Is within 10 days of symptom onset  Has at least one of the high risk factor(s) for progression to severe COVID-19 and/or hospitalization as defined in EUA.  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 FDA has authorized the emergency use for the treatment of mild to moderate COVID-19 in adults and pediatric patients with positive results of direct SARS-CoV-2 viral testing who are 22 years of age and older weighing at least 40 kg, and who are at high risk for progressing to severe COVID-19 and/or hospitalization.  2. The significant known and potential risks and benefits of COVID monoclonal antibody, and the extent to which such potential risks and benefits are unknown.  3. Information on available alternative treatments and the risks and benefits of those alternatives, including clinical trials.  4. Patients treated with COVID monoclonal antibody should continue to self-isolate and use infection control measures (e.g., wear mask, isolate, social distance, avoid sharing personal items, clean and disinfect "high touch" surfaces, and frequent handwashing) according to CDC guidelines.   5. The patient or parent/caregiver has the option to accept or refuse COVID monoclonal antibody treatment.  After reviewing this information with the patient, The patient agreed to  proceed with receiving casirivimab\imdevimab infusion and will be provided a copy of the Fact sheet prior to receiving the infusion.   Jeanine Luz, NP 02/07/2020 3:37 PM

## 2020-02-07 NOTE — Telephone Encounter (Signed)
Called to Discuss with patient about Covid symptoms and the use of the monoclonal antibody infusion for those with mild to moderate Covid symptoms and at a high risk of hospitalization.     Pt appears to qualify for this infusion due to co-morbid conditions and/or a member of an at-risk group in accordance with the FDA Emergency Use Authorization.    Ms. Stratton was seen at the [facility] ED on 9/22 with myalgias, loss of taste and smell, headache, nonproductive cough and backaches. COVID testing was positive. Sx started on 9/19. Qualifying risk factors include BMI >25 and high SVI.  I spoke with Ms. Adcox regarding the risks and benefits of treatment with Regeneron and she wishes to proceed with treatment.   Hello [name],   You have been scheduled to receive Regeneron (the monoclonal antibody we discussed) on : 02/07/2020 at 5:30pm  If you have been tested outside of a [facility] - you MUST bring a copy of your positive test with you the morning of your appointment. You may take a photo of this and upload to your MyChart portal or have the testing facility fax the result to [phone]    The address for the infusion clinic site is:  --GPS address is [address] - the parking is located near [employer] building where you will see  COVID19 Infusion feather banner marking the entrance to parking.   (see photos below)            --Enter into the 2nd entrance where the "wave, flag banner" is at the road. Turn into this 2nd entrance and immediately turn left to park in 1 of the 5 parking spots.   --Please stay in your car and call the desk for assistance inside [phone].   --Average time in department is roughly 2 hours for Regeneron treatment - this includes preparation of the medication, IV start and the required 1 hour monitoring after the infusion.    Should you develop worsening shortness of breath, chest pain or severe breathing problems please do not wait for  this appointment and go to the Emergency room for evaluation and treatment. You will undergo another oxygen screen before your infusion to ensure this is the best treatment option for you. There is a chance that the best decision may be to send you to the Emergency Room for evaluation at the time of your appointment.   The day of your visit you should: Marland Kitchen Get plenty of rest the night before and drink plenty of water . Eat a light meal/snack before coming and take your medications as prescribed  . Wear warm, comfortable clothes with a shirt that can roll-up over the elbow (will need IV start).  . Wear a mask  . Consider bringing some activity to help pass the time  Many commercial insurers are waiving bills related to COVID treatment however some have ranged from $300-640. We are starting to see some insurers send bills to patients later for the administration of the medication - we are learning more information but you may receive a bill after your appointment.  Please contact your insurance agent to discuss prior to your appointment if you would like further details about billing specific to your policy.    The CPT code is [phone] for your reference.    Marcos Eke, NP 02/07/2020 3:36 PM

## 2020-02-07 NOTE — Progress Notes (Signed)
  Diagnosis: COVID-19  Physician: Dr. Wright  Procedure: Covid Infusion Clinic Med: casirivimab\imdevimab infusion - Provided patient with casirivimab\imdevimab fact sheet for patients, parents and caregivers prior to infusion.  Complications: No immediate complications noted.  Discharge: Discharged home   Ruth Allen 02/07/2020   

## 2020-02-07 NOTE — Discharge Instructions (Signed)

## 2021-03-25 ENCOUNTER — Emergency Department (HOSPITAL_COMMUNITY): Payer: Self-pay

## 2021-03-25 ENCOUNTER — Emergency Department (HOSPITAL_COMMUNITY)
Admission: EM | Admit: 2021-03-25 | Discharge: 2021-03-25 | Disposition: A | Discharge status: Home / Self Care | Payer: Self-pay | Attending: Emergency Medicine | Admitting: Emergency Medicine

## 2021-03-25 ENCOUNTER — Encounter (HOSPITAL_COMMUNITY): Payer: Self-pay | Admitting: Emergency Medicine

## 2021-03-25 ENCOUNTER — Other Ambulatory Visit: Payer: Self-pay

## 2021-03-25 DIAGNOSIS — X500XXA Overexertion from strenuous movement or load, initial encounter: Secondary | ICD-10-CM | POA: Insufficient documentation

## 2021-03-25 DIAGNOSIS — S161XXA Strain of muscle, fascia and tendon at neck level, initial encounter: Secondary | ICD-10-CM | POA: Insufficient documentation

## 2021-03-25 DIAGNOSIS — R519 Headache, unspecified: Secondary | ICD-10-CM | POA: Insufficient documentation

## 2021-03-25 LAB — I-STAT CHEM 8, ED
BUN: 5 mg/dL — ABNORMAL LOW (ref 6–20)
Calcium, Ion: 1.13 mmol/L — ABNORMAL LOW (ref 1.15–1.40)
Chloride: 102 mmol/L (ref 98–111)
Creatinine, Ser: 0.7 mg/dL (ref 0.44–1.00)
Glucose, Bld: 89 mg/dL (ref 70–99)
HCT: 40 % (ref 36.0–46.0)
Hemoglobin: 13.6 g/dL (ref 12.0–15.0)
Potassium: 3.5 mmol/L (ref 3.5–5.1)
Sodium: 140 mmol/L (ref 135–145)
TCO2: 25 mmol/L (ref 22–32)

## 2021-03-25 LAB — I-STAT BETA HCG BLOOD, ED (MC, WL, AP ONLY): I-stat hCG, quantitative: <5 m[IU]/mL (ref <5)

## 2021-03-25 MED ORDER — ONDANSETRON 4 MG PO TBDP: 4.0000 mg | ORAL_TABLET | Freq: Three times a day (TID) | ORAL | 0 refills | Status: DC | PRN

## 2021-03-25 MED ORDER — KETOROLAC TROMETHAMINE 30 MG/ML IJ SOLN
15.0000 mg | Freq: Once | INTRAMUSCULAR | Status: AC
  Administered 2021-03-25: 08:00:00 15 mg via INTRAVENOUS
  Filled 2021-03-25: qty 1

## 2021-03-25 MED ORDER — METHOCARBAMOL 500 MG PO TABS: 500.0000 mg | ORAL_TABLET | Freq: Three times a day (TID) | ORAL | 0 refills | Status: DC | PRN

## 2021-03-25 MED ORDER — IOHEXOL 350 MG/ML SOLN
50.0000 mL | Freq: Once | INTRAVENOUS | Status: AC | PRN
  Administered 2021-03-25: 09:00:00 50 mL via INTRAVENOUS

## 2021-03-25 MED ORDER — PREDNISONE 20 MG PO TABS: 40.0000 mg | ORAL_TABLET | Freq: Every day | ORAL | 0 refills | Status: DC

## 2021-03-25 NOTE — ED Notes (Signed)
Pt verbalizes understanding of discharge instructions. Opportunity for questions and answers were provided. Pt discharged from the ED.   ?

## 2021-03-25 NOTE — ED Provider Notes (Signed)
[facility] EMERGENCY DEPARTMENT Provider Note   CSN: [id number] Arrival date & time: 03/25/21  0654     History Chief Complaint  Patient presents with   Neck Pain    Ruth Allen is a 30 y.o. female.   Neck Pain Associated symptoms: headaches   Associated symptoms: no chest pain   Patient presents with severe neck pain.  Has had since this weekend.  On Saturday she was at a parade [place] was carrying her 42-year-old for much of it.  Developed some pain that evening but no direct injury.  Pain is worse on the left side but does involve both sides and neck.  On Monday she went to see her chiropractor.  Had some adjustments in some needling.  Pain is gotten worse.  Severe.  She is tearful.  Pain with moving the neck along with limited mobility.  No numbness or weakness.  Does not really have neck problems before this.  No dizziness.  Does have some headaches at times.    Past Medical History:  Diagnosis Date   Medical history non-contributory     Patient Active Problem List   Diagnosis Date Noted   SVD 7/23 12/06/2017   Postpartum care following vaginal delivery 12/06/2017    No past surgical history on file.   OB History     Gravida  1   Para  1   Term  1   Preterm      AB      Living  1      SAB      IAB      Ectopic      Multiple  0   Live Births  1           No family history on file.  Social History   Tobacco Use   Smoking status: Never   Smokeless tobacco: Never  Substance Use Topics   Alcohol use: Never   Drug use: Never    Home Medications Prior to Admission medications   Medication Sig Start Date End Date Taking? Authorizing Provider  methocarbamol (ROBAXIN) 500 MG tablet Take 1 tablet (500 mg total) by mouth every 8 (eight) hours as needed for muscle spasms. 03/25/21  Yes Benjiman Core, MD  ondansetron (ZOFRAN-ODT) 4 MG disintegrating tablet Take 1 tablet (4 mg total) by mouth every 8 (eight) hours as  needed for nausea or vomiting. 03/25/21  Yes Benjiman Core, MD  predniSONE (DELTASONE) 20 MG tablet Take 2 tablets (40 mg total) by mouth daily. 03/25/21  Yes Benjiman Core, MD  acetaminophen (TYLENOL) 325 MG tablet Take 2 tablets (650 mg total) by mouth every 4 (four) hours as needed (for pain scale < 4  OR  temperature  >/=  100.5 F). 12/07/17   Neta Mends, CNM  benzocaine-Menthol (DERMOPLAST) 20-0.5 % AERO Apply 1 application topically as needed for irritation (perineal discomfort). 12/07/17   Neta Mends, CNM  coconut oil OIL Apply 1 application topically as needed. 12/07/17   Neta Mends, CNM  ibuprofen (ADVIL,MOTRIN) 600 MG tablet Take 1 tablet (600 mg total) by mouth every 6 (six) hours. 12/07/17   Neta Mends, CNM  Prenatal Vit-Fe Fumarate-FA (PRENATAL MULTIVITAMIN) TABS tablet Take 1 tablet by mouth daily at 12 noon.    [provider]    Allergies    Patient has no known allergies.  Review of Systems   Review of Systems  Constitutional:  Negative for appetite change.  HENT:  Negative for congestion.   Respiratory:  Negative for shortness of breath.   Cardiovascular:  Negative for chest pain.  Gastrointestinal:  Negative for abdominal pain.  Genitourinary:  Negative for flank pain.  Musculoskeletal:  Positive for neck pain.  Skin:  Negative for rash.  Neurological:  Positive for headaches.  Psychiatric/Behavioral:  Negative for confusion.    Physical Exam Updated Vital Signs BP 114/71 (BP Location: Left Arm)   Pulse 63   Temp 98.1 F (36.7 C) (Oral)   Resp 16   LMP 03/21/2021 (Exact Date)   SpO2 100%   Physical Exam Vitals and nursing note reviewed.  HENT:     Head: Atraumatic.  Eyes:     Pupils: Pupils are equal, round, and reactive to light.  Neck:     Comments: Some tenderness to lower neck posteriorly both on the midline and on particularly the left-sided musculature.  Decreased range of motion.  No rash. Cardiovascular:     Rate  and Rhythm: Regular rhythm.  Abdominal:     Tenderness: There is no abdominal tenderness.  Musculoskeletal:     Right lower leg: No edema.     Left lower leg: No edema.  Skin:    General: Skin is warm.     Capillary Refill: Capillary refill takes less than 2 seconds.  Neurological:     Mental Status: She is alert and oriented to person, place, and time.    ED Results / Procedures / Treatments   Labs (all labs ordered are listed, but only abnormal results are displayed) Labs Reviewed  I-STAT CHEM 8, ED - Abnormal; Notable for the following components:      Result Value   BUN 5 (*)    Calcium, Ion 1.13 (*)    All other components within normal limits  I-STAT BETA HCG BLOOD, ED ([facility], [facility], [facility] ONLY)    EKG None  Radiology CT Angio Neck W and/or Wo Contrast  Result Date: 03/25/2021 CLINICAL DATA:  Vertebral artery dissection suspected; worsening neck pain after chiropractor visit EXAM: CT ANGIOGRAPHY NECK TECHNIQUE: Multidetector CT imaging of the neck was performed using the standard protocol during bolus administration of intravenous contrast. Multiplanar CT image reconstructions and MIPs were obtained to evaluate the vascular anatomy. Carotid stenosis measurements (when applicable) are obtained utilizing NASCET criteria, using the distal internal carotid diameter as the denominator. CONTRAST:  33mL OMNIPAQUE IOHEXOL 350 MG/ML SOLN COMPARISON:  None. FINDINGS: Aortic arch: Great vessel origins are patent. Right carotid system: Patent. No stenosis or evidence of dissection. Left carotid system: Patent.  No stenosis or evidence of dissection. Vertebral arteries: Patent. Codominant. No stenosis or evidence of dissection. Skeleton: No acute abnormality. Other neck: Unremarkable. Upper chest: Included upper lungs are clear. IMPRESSION: No large vessel occlusion, hemodynamically significant stenosis, or evidence of dissection. Electronically Signed   By: Guadlupe Spanish M.D.   On: 03/25/2021 09:15     Procedures Procedures   Medications Ordered in ED Medications  ketorolac (TORADOL) 30 MG/ML injection 15 mg (15 mg Intravenous Given 03/25/21 0807)  iohexol (OMNIPAQUE) 350 MG/ML injection 50 mL (50 mLs Intravenous Contrast Given 03/25/21 5643)    ED Course  I have reviewed the triage vital signs and the nursing notes.  Pertinent labs & imaging results that were available during my care of the patient were reviewed by me and considered in my medical decision making (see chart for details).    MDM Rules/Calculators/A&P  Patient presented after neck pain.  Lifting child a few days ago and holding during a parade later developed pain.  Has muscular tenderness.  However did see chiropractor and worsening pain after.  CT angiography done to rule out vertebral dissection.  CT reassuring.  Feels somewhat better after some Toradol.  We will treat symptomatically with steroids and muscle relaxers.  Outpatient follow-up as needed.  Doubt fracture.  No mass seen.  Discharge home Final Clinical Impression(s) / ED Diagnoses Final diagnoses:  Acute strain of neck muscle, initial encounter    Rx / DC Orders ED Discharge Orders          Ordered    methocarbamol (ROBAXIN) 500 MG tablet  Every 8 hours PRN        03/25/21 0949    ondansetron (ZOFRAN-ODT) 4 MG disintegrating tablet  Every 8 hours PRN        03/25/21 0949    predniSONE (DELTASONE) 20 MG tablet  Daily        03/25/21 0949             Benjiman Core, MD 03/25/21 [phone]

## 2021-03-25 NOTE — ED Triage Notes (Signed)
Patient complains of neck pain and stiffness since Saturday evening after carrying her infant son along a parade. Patient states she tried to going to chiropractor on Monday but states pain is worse since treatment there. Patient is afebrile, alert, oriented, and in no apparent distress at this time.

## 2021-03-25 NOTE — ED Notes (Signed)
Patient transported to CT 

## 2021-03-25 NOTE — Discharge Instructions (Signed)
Follow-up with your primary care doctor as needed

## 2021-03-29 ENCOUNTER — Other Ambulatory Visit: Payer: Self-pay

## 2021-03-29 ENCOUNTER — Emergency Department (HOSPITAL_COMMUNITY)
Admission: EM | Admit: 2021-03-29 | Discharge: 2021-03-29 | Disposition: A | Discharge status: Home / Self Care | Payer: Medicaid Other | Attending: Emergency Medicine | Admitting: Emergency Medicine

## 2021-03-29 ENCOUNTER — Encounter (HOSPITAL_COMMUNITY): Payer: Self-pay | Admitting: Emergency Medicine

## 2021-03-29 DIAGNOSIS — S161XXA Strain of muscle, fascia and tendon at neck level, initial encounter: Secondary | ICD-10-CM | POA: Insufficient documentation

## 2021-03-29 DIAGNOSIS — X58XXXA Exposure to other specified factors, initial encounter: Secondary | ICD-10-CM | POA: Insufficient documentation

## 2021-03-29 MED ORDER — LIDOCAINE 5 % EX PTCH: 1.0000 | MEDICATED_PATCH | CUTANEOUS | 0 refills | Status: DC

## 2021-03-29 MED ORDER — NAPROXEN 500 MG PO TABS: 500.0000 mg | ORAL_TABLET | Freq: Two times a day (BID) | ORAL | 0 refills | Status: DC

## 2021-03-29 MED ORDER — METHOCARBAMOL 500 MG PO TABS: 500.0000 mg | ORAL_TABLET | Freq: Three times a day (TID) | ORAL | 0 refills | Status: DC | PRN

## 2021-03-29 NOTE — Discharge Instructions (Addendum)
I have sent more medication to the pharmacy.  You will need to follow-up with a primary care provider or sports medicine specialist in order to get ongoing treatment for this.  It is likely that you need physical therapy.  Some basic information about stretches attached to these discharge papers.  I hope that you feel better.

## 2021-03-29 NOTE — ED Triage Notes (Signed)
Pt reports continued neck pain x 1 week.  Seen in ED 11/9 for same and states muscle relaxers did help some but she only had enough for 3 days.

## 2021-03-29 NOTE — ED Provider Notes (Signed)
[facility] EMERGENCY DEPARTMENT Provider Note   CSN: [id number] Arrival date & time: 03/29/21  1008     History Chief Complaint  Patient presents with   Neck Pain    Ruth Allen is a 30 y.o. female with no pertinent past medical history presenting today with a complaint of ongoing neck pain.  Patient was evaluated earlier this week however states that the pain is not getting better.  Ibuprofen and Robaxin helped her however she was only given enough for a couple of days.  Is due to get in with a orthopedic specialist later this week.  No numbness or tingling.  There was no injury or trauma that incited this discomfort.  No fevers, chills or photophobia.      Past Medical History:  Diagnosis Date   Medical history non-contributory     Patient Active Problem List   Diagnosis Date Noted   SVD 7/23 12/06/2017   Postpartum care following vaginal delivery 12/06/2017    History reviewed. No pertinent surgical history.   OB History     Gravida  1   Para  1   Term  1   Preterm      AB      Living  1      SAB      IAB      Ectopic      Multiple  0   Live Births  1           No family history on file.  Social History   Tobacco Use   Smoking status: Never   Smokeless tobacco: Never  Substance Use Topics   Alcohol use: Never   Drug use: Never    Home Medications Prior to Admission medications   Medication Sig Start Date End Date Taking? Authorizing Provider  lidocaine (LIDODERM) 5 % Place 1 patch onto the skin daily. Remove & Discard patch within 12 hours or as directed by MD 03/29/21  Yes Tomica Arseneault A, PA-C  naproxen (NAPROSYN) 500 MG tablet Take 1 tablet (500 mg total) by mouth 2 (two) times daily. 03/29/21  Yes Remmy Crass A, PA-C  acetaminophen (TYLENOL) 325 MG tablet Take 2 tablets (650 mg total) by mouth every 4 (four) hours as needed (for pain scale < 4  OR  temperature  >/=  100.5 F). 12/07/17   Neta Mends,  CNM  benzocaine-Menthol (DERMOPLAST) 20-0.5 % AERO Apply 1 application topically as needed for irritation (perineal discomfort). 12/07/17   Neta Mends, CNM  coconut oil OIL Apply 1 application topically as needed. 12/07/17   Neta Mends, CNM  ibuprofen (ADVIL,MOTRIN) 600 MG tablet Take 1 tablet (600 mg total) by mouth every 6 (six) hours. 12/07/17   Neta Mends, CNM  methocarbamol (ROBAXIN) 500 MG tablet Take 1 tablet (500 mg total) by mouth every 8 (eight) hours as needed for muscle spasms. 03/29/21   Chrystopher Stangl A, PA-C  ondansetron (ZOFRAN-ODT) 4 MG disintegrating tablet Take 1 tablet (4 mg total) by mouth every 8 (eight) hours as needed for nausea or vomiting. 03/25/21   Benjiman Core, MD  predniSONE (DELTASONE) 20 MG tablet Take 2 tablets (40 mg total) by mouth daily. 03/25/21   Benjiman Core, MD  Prenatal Vit-Fe Fumarate-FA (PRENATAL MULTIVITAMIN) TABS tablet Take 1 tablet by mouth daily at 12 noon.    [provider]    Allergies    Patient has no known allergies.  Review of Systems  Review of Systems  Constitutional:  Negative for chills and fever.  Gastrointestinal:  Negative for nausea and vomiting.  Musculoskeletal:  Positive for neck pain. Negative for back pain.  Neurological:  Negative for weakness and numbness.  All other systems reviewed and are negative.  Physical Exam Updated Vital Signs BP 123/73 (BP Location: Right Arm)   Pulse 68   Temp 98.8 F (37.1 C) (Oral)   Resp 18   LMP 03/21/2021 (Exact Date)   SpO2 100%   Physical Exam Vitals and nursing note reviewed.  Constitutional:      Appearance: Normal appearance.  HENT:     Head: Normocephalic and atraumatic.  Eyes:     General: No scleral icterus.    Conjunctiva/sclera: Conjunctivae normal.  Neck:     Comments: Range of motion limited due to pain.  Full passive range of motion. Cardiovascular:     Rate and Rhythm: Normal rate and regular rhythm.  Pulmonary:     Effort:  Pulmonary effort is normal. No respiratory distress.  Skin:    Findings: No rash.  Neurological:     Mental Status: She is alert.  Psychiatric:        Mood and Affect: Mood normal.    ED Results / Procedures / Treatments   Labs (all labs ordered are listed, but only abnormal results are displayed) Labs Reviewed - No data to display  EKG None  Radiology No results found.  Procedures Procedures   Medications Ordered in ED Medications - No data to display  ED Course  I have reviewed the triage vital signs and the nursing notes.  Pertinent labs & imaging results that were available during my care of the patient were reviewed by me and considered in my medical decision making (see chart for details).    MDM Rules/Calculators/A&P Patient was evaluated by me.  Her symptoms appear to be the same as they were previously.  Physical exam also significant.  She had negative imaging in her last visit.  A potential dissection was considered however the angiogram ruled this out.  She is requesting more muscle relaxants and NSAIDs until she can get to her appointment later this week.  I am agreeable to this.  I will also send lidocaine patches to the pharmacy.  At this point she is stable for discharge. Final Clinical Impression(s) / ED Diagnoses Final diagnoses:  Strain of neck muscle, initial encounter    Rx / DC Orders ED Discharge Orders          Ordered    methocarbamol (ROBAXIN) 500 MG tablet  Every 8 hours PRN        03/29/21 1202    lidocaine (LIDODERM) 5 %  Every 24 hours        03/29/21 1202    naproxen (NAPROSYN) 500 MG tablet  2 times daily        03/29/21 1202           Results and diagnoses were explained to the patient. Return precautions discussed in full. Patient had no additional questions and expressed complete understanding.    Woodroe Chen 03/29/21 1249    Gloris Manchester, MD 03/30/21 0000

## 2021-03-29 NOTE — ED Notes (Signed)
Discharged by PA at triage. 

## 2021-04-07 ENCOUNTER — Ambulatory Visit: Payer: Self-pay | Admitting: *Deleted

## 2021-04-07 ENCOUNTER — Telehealth: Payer: Medicaid Other | Admitting: Physician Assistant

## 2021-04-07 DIAGNOSIS — M542 Cervicalgia: Secondary | ICD-10-CM

## 2021-04-07 MED ORDER — PREDNISONE 20 MG PO TABS: 40.0000 mg | ORAL_TABLET | Freq: Every day | ORAL | 0 refills | Status: DC

## 2021-04-07 MED ORDER — CYCLOBENZAPRINE HCL 10 MG PO TABS: 10.0000 mg | ORAL_TABLET | Freq: Three times a day (TID) | ORAL | 0 refills | Status: DC | PRN

## 2021-04-07 NOTE — Patient Instructions (Signed)
[name], thank you for joining [name], PA-C for today's virtual visit.  While this provider is not your primary care provider (PCP), if your PCP is located in our provider database this encounter information will be shared with them immediately following your visit.  Consent: (Patient) [name] provided verbal consent for this virtual visit at the beginning of the encounter.  Current Medications:  Current Outpatient Medications:    cyclobenzaprine (FLEXERIL) 10 MG tablet, Take 1 tablet (10 mg total) by mouth 3 (three) times daily as needed for muscle spasms., Disp: 30 tablet, Rfl: 0   acetaminophen (TYLENOL) 325 MG tablet, Take 2 tablets (650 mg total) by mouth every 4 (four) hours as needed (for pain scale < 4  OR  temperature  >/=  100.5 F)., Disp: , Rfl:    benzocaine-Menthol (DERMOPLAST) 20-0.5 % AERO, Apply 1 application topically as needed for irritation (perineal discomfort)., Disp: , Rfl:    coconut oil OIL, Apply 1 application topically as needed., Disp: , Rfl: 0   ibuprofen (ADVIL,MOTRIN) 600 MG tablet, Take 1 tablet (600 mg total) by mouth every 6 (six) hours., Disp: 30 tablet, Rfl: 0   lidocaine (LIDODERM) 5 %, Place 1 patch onto the skin daily. Remove & Discard patch within 12 hours or as directed by MD, Disp: 30 patch, Rfl: 0   naproxen (NAPROSYN) 500 MG tablet, Take 1 tablet (500 mg total) by mouth 2 (two) times daily., Disp: 30 tablet, Rfl: 0   ondansetron (ZOFRAN-ODT) 4 MG disintegrating tablet, Take 1 tablet (4 mg total) by mouth every 8 (eight) hours as needed for nausea or vomiting., Disp: 8 tablet, Rfl: 0   predniSONE (DELTASONE) 20 MG tablet, Take 2 tablets (40 mg total) by mouth daily., Disp: 20 tablet, Rfl: 0   Prenatal Vit-Fe Fumarate-FA (PRENATAL MULTIVITAMIN) TABS tablet, Take 1 tablet by mouth daily at 12 noon., Disp: , Rfl:    Medications ordered in this encounter:  Meds ordered this encounter  Medications   predniSONE (DELTASONE) 20 MG tablet     Sig: Take 2 tablets (40 mg total) by mouth daily.    Dispense:  20 tablet    Refill:  0    Order Specific Question:   Supervising Provider    Answer:   Hyacinth Meeker, BRIAN [[id number]]   cyclobenzaprine (FLEXERIL) 10 MG tablet    Sig: Take 1 tablet (10 mg total) by mouth 3 (three) times daily as needed for muscle spasms.    Dispense:  30 tablet    Refill:  0    Order Specific Question:   Supervising Provider    Answer:   Hyacinth Meeker, BRIAN [[id number]]     *If you need refills on other medications prior to your next appointment, please contact your pharmacy*  Follow-Up: Call back or seek an in-person evaluation if the symptoms worsen or if the condition fails to improve as anticipated.  Other Instructions [facility]   [address],  [place], [place] [address]   GET DIRECTIONS   CONTACT   Phone: [phone]   Phone: [phone]   URGENT CARE HOURS   Monday - Friday:   8:00am to 8:00pm   Saturday:   10:00am to 3:00pm         [facility]   [address]  [place], [place] [address]   GET DIRECTIONS   CONTACT   Phone: [phone]   URGENT CARE HOURS   Monday:   9:00AM - 9:00PM   Tuesday:  9:00AM - 9:00PM   Wednesday:   9:00AM - 9:00PM   Thursday:   9:00AM - 9:00PM   Friday:   9:00AM - 9:00PM   Saturday:   9:00AM - 9:00PM   Sunday:   9:00AM - 9:00PM   HOLIDAY HOURS   Holidays:   8:30AM - 4:30PM      [place]   After [facility] [phone]      [facility] [address] [place], [place] [address]      EVENINGS & WEEKENDS NO APPOINTMENT NECESSARY Mon-Fri 5:30PM - 9PM    Sat 9 AM - 2 PM Sun 10 AM - 2 PM   Neck Exercises Ask your health care provider which exercises are safe for you. Do exercises exactly as told by your health care provider and adjust them as directed. It is normal to feel mild stretching,  pulling, tightness, or discomfort as you do these exercises. Stop right away if you feel sudden pain or your pain gets worse. Do not begin these exercises until told by your health care provider. Neck exercises can be important for many reasons. They can improve strength and maintain flexibility in your neck, which will help your upper back and prevent neck pain. Stretching exercises Rotation neck stretching  Sit in a chair or stand up. Place your feet flat on the floor, shoulder-width apart. Slowly turn your head (rotate) to the right until a slight stretch is felt. Turn it all the way to the right so you can look over your right shoulder. Do not tilt or tip your head. Hold this position for 10-30 seconds. Slowly turn your head (rotate) to the left until a slight stretch is felt. Turn it all the way to the left so you can look over your left shoulder. Do not tilt or tip your head. Hold this position for 10-30 seconds. Repeat __________ times. Complete this exercise __________ times a day. Neck retraction  Sit in a sturdy chair or stand up. Look straight ahead. Do not bend your neck. Use your fingers to push your chin backward (retraction). Do not bend your neck for this movement. Continue to face straight ahead. If you are doing the exercise properly, you will feel a slight sensation in your throat and a stretch at the back of your neck. Hold the stretch for 1-2 seconds. Repeat __________ times. Complete this exercise __________ times a day. Strengthening exercises Neck press  Lie on your back on a firm bed or on the floor with a pillow under your head. Use your neck muscles to push your head down on the pillow and straighten your spine. Hold the position as well as you can. Keep your head facing up (in a neutral position) and your chin tucked. Slowly count to 5 while holding this position. Repeat __________ times. Complete this exercise __________ times a day. Isometrics These are  exercises in which you strengthen the muscles in your neck while keeping your neck still (isometrics). Sit in a supportive chair and place your hand on your forehead. Keep your head and face facing straight ahead. Do not flex or extend your neck while doing isometrics. Push forward with your head and neck while pushing back with your hand. Hold for 10 seconds. Do the sequence again, this time putting your hand against the back of your head. Use your head and neck to push backward against the hand pressure. Finally, do the same exercise on either side of your  head, pushing sideways against the pressure of your hand. Repeat __________ times. Complete this exercise __________ times a day. Prone head lifts  Lie face-down (prone position), resting on your elbows so that your chest and upper back are raised. Start with your head facing downward, near your chest. Position your chin either on or near your chest. Slowly lift your head upward. Lift until you are looking straight ahead. Then continue lifting your head as far back as you can comfortably stretch. Hold your head up for 5 seconds. Then slowly lower it to your starting position. Repeat __________ times. Complete this exercise __________ times a day. Supine head lifts  Lie on your back (supine position), bending your knees to point to the ceiling and keeping your feet flat on the floor. Lift your head slowly off the floor, raising your chin toward your chest. Hold for 5 seconds. Repeat __________ times. Complete this exercise __________ times a day. Scapular retraction  Stand with your arms at your sides. Look straight ahead. Slowly pull both shoulders (scapulae) backward and downward (retraction) until you feel a stretch between your shoulder blades in your upper back. Hold for 10-30 seconds. Relax and repeat. Repeat __________ times. Complete this exercise __________ times a day. Contact a health care provider if: Your neck pain or  discomfort gets worse when you do an exercise. Your neck pain or discomfort does not improve within 2 hours after you exercise. If you have any of these problems, stop exercising right away. Do not do the exercises again unless your health care provider says that you can. Get help right away if: You develop sudden, severe neck pain. If this happens, stop exercising right away. Do not do the exercises again unless your health care provider says that you can. This information is not intended to replace advice given to you by your health care provider. Make sure you discuss any questions you have with your health care provider. Document Revised: 10/28/2020 Document Reviewed: 10/28/2020 Elsevier Patient Education  2022 [employer].    If you have been instructed to have an in-person evaluation today at a local Urgent Care facility, please use the link below. It will take you to a list of all of our available [facility], including address, phone number and hours of operation. Please do not delay care.  [facility]  If you or a family member do not have a primary care provider, use the link below to schedule a visit and establish care. When you choose a [facility] primary care physician or advanced practice provider, you gain a long-term partner in health. Find a Primary Care Provider  Learn more about [facility]'s in-office and virtual care options: [place] - Get Care Now

## 2021-04-07 NOTE — Progress Notes (Signed)
Virtual Visit Consent   Ruth Allen, you are scheduled for a virtual visit with a [place] provider today.     Just as with appointments in the office, your consent must be obtained to participate.  Your consent will be active for this visit and any virtual visit you may have with one of our providers in the next 365 days.     If you have a MyChart account, a copy of this consent can be sent to you electronically.  All virtual visits are billed to your insurance company just like a traditional visit in the office.    As this is a virtual visit, video technology does not allow for your provider to perform a traditional examination.  This may limit your provider's ability to fully assess your condition.  If your provider identifies any concerns that need to be evaluated in person or the need to arrange testing (such as labs, EKG, etc.), we will make arrangements to do so.     Although advances in technology are sophisticated, we cannot ensure that it will always work on either your end or our end.  If the connection with a video visit is poor, the visit may have to be switched to a telephone visit.  With either a video or telephone visit, we are not always able to ensure that we have a secure connection.     I need to obtain your verbal consent now.   Are you willing to proceed with your visit today?    Ruth Allen has provided verbal consent on 04/07/2021 for a virtual visit (video or telephone).   [name], PA-C   Date: 04/07/2021 5:05 PM   Virtual Visit via Video Note   I, [name], connected with  Ruth Allen  ([id number], [birth year]) on 04/07/21 at  5:00 PM EST by a video-enabled telemedicine application and verified that I am speaking with the correct person using two identifiers.  Location: Patient: Virtual Visit Location Patient: Home Provider: Virtual Visit Location Provider: Home Office   I discussed the limitations of evaluation and management by  telemedicine and the availability of in person appointments. The patient expressed understanding and agreed to proceed.    History of Present Illness: Ruth Allen is a 30 y.o. who identifies as a female who was assigned female at birth, and is being seen today for continued neck pain. She was seen in the ER on 03/25/21 and 03/29/21 for the same issue. Reports symptoms had been improving with the muscle relaxer and the steroid but symptoms worsened acutely yesterday after doing her hair. Painful to move neck and arms.    Problems:  Patient Active Problem List   Diagnosis Date Noted   SVD 7/23 12/06/2017   Postpartum care following vaginal delivery 12/06/2017    Allergies: No Known Allergies Medications:  Current Outpatient Medications:    cyclobenzaprine (FLEXERIL) 10 MG tablet, Take 1 tablet (10 mg total) by mouth 3 (three) times daily as needed for muscle spasms., Disp: 30 tablet, Rfl: 0   acetaminophen (TYLENOL) 325 MG tablet, Take 2 tablets (650 mg total) by mouth every 4 (four) hours as needed (for pain scale < 4  OR  temperature  >/=  100.5 F)., Disp: , Rfl:    benzocaine-Menthol (DERMOPLAST) 20-0.5 % AERO, Apply 1 application topically as needed for irritation (perineal discomfort)., Disp: , Rfl:    coconut oil OIL, Apply 1 application topically as needed., Disp: , Rfl: 0   ibuprofen (ADVIL,MOTRIN)  600 MG tablet, Take 1 tablet (600 mg total) by mouth every 6 (six) hours., Disp: 30 tablet, Rfl: 0   lidocaine (LIDODERM) 5 %, Place 1 patch onto the skin daily. Remove & Discard patch within 12 hours or as directed by MD, Disp: 30 patch, Rfl: 0   naproxen (NAPROSYN) 500 MG tablet, Take 1 tablet (500 mg total) by mouth 2 (two) times daily., Disp: 30 tablet, Rfl: 0   ondansetron (ZOFRAN-ODT) 4 MG disintegrating tablet, Take 1 tablet (4 mg total) by mouth every 8 (eight) hours as needed for nausea or vomiting., Disp: 8 tablet, Rfl: 0   predniSONE (DELTASONE) 20 MG tablet, Take 2 tablets (40 mg  total) by mouth daily., Disp: 20 tablet, Rfl: 0   Prenatal Vit-Fe Fumarate-FA (PRENATAL MULTIVITAMIN) TABS tablet, Take 1 tablet by mouth daily at 12 noon., Disp: , Rfl:   Observations/Objective: Patient is well-developed, well-nourished in no acute distress.  Resting comfortably at home in obvious discomfort with heating pad on neck Head is normocephalic, atraumatic.  No labored breathing. Speech is clear and coherent with logical content.  Patient is alert and oriented at baseline.    Assessment and Plan: 1. Neck pain, musculoskeletal - predniSONE (DELTASONE) 20 MG tablet; Take 2 tablets (40 mg total) by mouth daily.  Dispense: 20 tablet; Refill: 0 - cyclobenzaprine (FLEXERIL) 10 MG tablet; Take 1 tablet (10 mg total) by mouth 3 (three) times daily as needed for muscle spasms.  Dispense: 30 tablet; Refill: 0  - Will change muscle relaxer to flexeril - Add prednisone back - Light stretches and exercises - Continue heat therapy intermittently throughout the day - Seek further evaluation at [facility] if symptoms persist  Follow Up Instructions: I discussed the assessment and treatment plan with the patient. The patient was provided an opportunity to ask questions and all were answered. The patient agreed with the plan and demonstrated an understanding of the instructions.  A copy of instructions were sent to the patient via MyChart unless otherwise noted below.    The patient was advised to call back or seek an in-person evaluation if the symptoms worsen or if the condition fails to improve as anticipated.  Time:  I spent 12 minutes with the patient via telehealth technology discussing the above problems/concerns.    [name], PA-C

## 2021-04-07 NOTE — Telephone Encounter (Signed)
Pt's husband calling, pt present. Pt seen in ED x 2 for neck pain. Last ED visit 03/29/21. Imaging done. Pt given Robaxin, husband verbalizes need for refill. Triaged pt who reports 10/10 neck pain, constant, worsening from ED visit. States now in shoulders, some tingling of right arm.  States can not turn head. Husband initially said pt could not speak on call "Because the vibration causes her pain." Advised ED eval as pt stated pain worsening, now in shoulders and arm tingling. Asked to speak with supervisor,call given to Karl Luke, Team Leader.  No PCP.       Reason for Disposition  Weakness of an arm or hand    Tingling of right arm  Answer Assessment - Initial Assessment Questions 1. ONSET: "When did the pain begin?"      2 weeks ago 2. LOCATION: "Where does it hurt?"      Entire neck 3. PATTERN "Does the pain come and go, or has it been constant since it started?"      constant 4. SEVERITY: "How bad is the pain?"  (Scale 1-10; or mild, moderate, severe)   - NO PAIN (0): no pain or only slight stiffness    - MILD (1-3): doesn't interfere with normal activities    - MODERATE (4-7): interferes with normal activities or awakens from sleep    - SEVERE (8-10):  excruciating pain, unable to do any normal activities      10/10 5. RADIATION: "Does the pain go anywhere else, shoot into your arms?"     Shoulders 6. CORD SYMPTOMS: "Any weakness or numbness of the arms or legs?"     Tingling in arms right arm 7. CAUSE: "What do you think is causing the neck pain?"     Unsure 8. NECK OVERUSE: "Any recent activities that involved turning or twisting the neck?"     no 9. OTHER SYMPTOMS: "Do you have any other symptoms?" (e.g., headache, fever, chest pain, difficulty breathing, neck swelling)     No 10. PREGNANCY: "Is there any chance you are pregnant?" "When was your last menstrual period?"  Protocols used: Neck Pain or Stiffness-A-AH

## 2021-04-07 NOTE — Telephone Encounter (Signed)
Patient's husband called back after the call was dropped, he says he hung up. Patient gave verbal permission to speak to the husband. He says she was seen in the ED for neck pain and was prescribed a muscle relaxer and he says she has run out of the medicines prescribed and wants to know if they can be refilled. I advised the prior nurse recommended to go to the ED because there is no way to have a ED provider refill the medications without assessing the patient. He says she cannot get out of bed due to the pain and it hurts for her to even do a lot of talking. I advised she could do a [facility] Visit in lieu of the ED to speak to a provider and that provider will assess and give recommendations. He asked will that provider have the capability to send in refills. I advised the provider does and it's up to them how they will proceed. He verbalized understanding and appreciated the time.

## 2021-06-14 ENCOUNTER — Telehealth: Payer: Medicaid Other | Admitting: Emergency Medicine

## 2021-06-14 ENCOUNTER — Telehealth (INDEPENDENT_AMBULATORY_CARE_PROVIDER_SITE_OTHER): Payer: Self-pay

## 2021-06-14 ENCOUNTER — Encounter: Payer: Self-pay | Admitting: Emergency Medicine

## 2021-06-14 DIAGNOSIS — M549 Dorsalgia, unspecified: Secondary | ICD-10-CM

## 2021-06-14 MED ORDER — NAPROXEN 500 MG PO TABS: 500.0000 mg | ORAL_TABLET | Freq: Two times a day (BID) | ORAL | 0 refills | Status: DC

## 2021-06-14 MED ORDER — TIZANIDINE HCL 2 MG PO CAPS: 2.0000 mg | ORAL_CAPSULE | Freq: Three times a day (TID) | ORAL | 0 refills | Status: DC

## 2021-06-14 NOTE — Progress Notes (Signed)
Hi Corrissa,   Sorry your daughter is not feeling well!  We are unable to prescribe medications through your chart for your daughter.  Unfortunately, you will need to make an appointment for her in order for her to get treatment.  I know we have an age limit for e-visits and video visits, so she may need to be seen in person or do another e-visit/ video visit with another company.  Sorry for the inconvenience!   Thank you,

## 2021-06-14 NOTE — Progress Notes (Signed)
Back pain  We are sorry that you are not feeling well.  Here is how we plan to help!  Based on what you have shared with me it looks like you mostly have acute back pain.  Acute back pain is defined as musculoskeletal pain that can resolve in 1-3 weeks with conservative treatment.  I have prescribed Naprosyn 500 mg take one by mouth twice a day non-steroid anti-inflammatory (NSAID) as well as Tizanidine 2 mg every eight hours as needed which is a muscle relaxer  Some patients experience stomach irritation or in increased heartburn with anti-inflammatory drugs.  Please keep in mind that muscle relaxer's can cause fatigue and should not be taken while at work or driving.  Back pain is very common.  The pain often gets better over time.  The cause of back pain is usually not dangerous.  Most people can learn to manage their back pain on their own.  I will treat you for your back pain, but you should seek in person evaluation for your cough and low grade fever.  These symptoms in addition to the back pain can be associated with a  pneumonia.  Please go in person to have a physical exam and/or imaging to rule that out!  Home Care Stay active.  Start with short walks on flat ground if you can.  Try to walk farther each day. Do not sit, drive or stand in one place for more than 30 minutes.  Do not stay in bed. Do not avoid exercise or work.  Activity can help your back heal faster. Be careful when you bend or lift an object.  Bend at your knees, keep the object close to you, and do not twist. Sleep on a firm mattress.  Lie on your side, and bend your knees.  If you lie on your back, put a pillow under your knees. Only take medicines as told by your doctor. Put ice on the injured area. Put ice in a plastic bag Place a towel between your skin and the bag Leave the ice on for 15-20 minutes, 3-4 times a day for the first 2-3 days. 210 After that, you can switch between ice and heat packs. Ask your doctor  about back exercises or massage. Avoid feeling anxious or stressed.  Find good ways to deal with stress, such as exercise.  Get Help Right Way If: Your pain does not go away with rest or medicine. Your pain does not go away in 1 week. You have new problems. You do not feel well. The pain spreads into your legs. You cannot control when you poop (bowel movement) or pee (urinate) You feel sick to your stomach (nauseous) or throw up (vomit) You have belly (abdominal) pain. You feel like you may pass out (faint). If you develop a fever.  Make Sure you: Understand these instructions. Will watch your condition Will get help right away if you are not doing well or get worse.  Your e-visit answers were reviewed by a board certified advanced clinical practitioner to complete your personal care plan.  Depending on the condition, your plan could have included both over the counter or prescription medications.  If there is a problem please reply  once you have received a response from your provider.  Your safety is important to [place].  If you have drug allergies check your prescription carefully.    You can use MyChart to ask questions about todays visit, request a non-urgent call back, or ask  for a work or school excuse for 24 hours related to this e-Visit. If it has been greater than 24 hours you will need to follow up with your provider, or enter a new e-Visit to address those concerns.  You will get an e-mail in the next two days asking about your experience.  I hope that your e-visit has been valuable and will speed your recovery. Thank you for using e-visits.

## 2021-06-14 NOTE — Progress Notes (Signed)
I have spent 5 minutes in review of e-visit questionnaire, review and updating patient chart, medical decision making and response to patient.   Eliza Green, PA-C    

## 2021-06-19 NOTE — Progress Notes (Signed)
Error.  Patient attempted to make visit for daughter

## 2021-09-28 ENCOUNTER — Other Ambulatory Visit: Payer: Self-pay

## 2021-09-28 ENCOUNTER — Emergency Department (HOSPITAL_COMMUNITY)
Admission: EM | Admit: 2021-09-28 | Discharge: 2021-09-28 | Discharge status: Against Medical Advice | Payer: Medicaid Other | Attending: Emergency Medicine | Admitting: Emergency Medicine

## 2021-09-28 ENCOUNTER — Encounter (HOSPITAL_COMMUNITY): Payer: Self-pay

## 2021-09-28 ENCOUNTER — Telehealth: Payer: Medicaid Other | Admitting: Physician Assistant

## 2021-09-28 DIAGNOSIS — R110 Nausea: Secondary | ICD-10-CM | POA: Diagnosis not present

## 2021-09-28 DIAGNOSIS — Z5321 Procedure and treatment not carried out due to patient leaving prior to being seen by health care provider: Secondary | ICD-10-CM | POA: Diagnosis not present

## 2021-09-28 DIAGNOSIS — R1013 Epigastric pain: Secondary | ICD-10-CM | POA: Insufficient documentation

## 2021-09-28 DIAGNOSIS — R140 Abdominal distension (gaseous): Secondary | ICD-10-CM

## 2021-09-28 DIAGNOSIS — R1033 Periumbilical pain: Secondary | ICD-10-CM | POA: Diagnosis not present

## 2021-09-28 LAB — COMPREHENSIVE METABOLIC PANEL
ALT: 18 U/L (ref 0–44)
AST: 20 U/L (ref 15–41)
Albumin: 3.5 g/dL (ref 3.5–5.0)
Alkaline Phosphatase: 57 U/L (ref 38–126)
Anion gap: 5 (ref 5–15)
BUN: 5 mg/dL — ABNORMAL LOW (ref 6–20)
CO2: 27 mmol/L (ref 22–32)
Calcium: 8.8 mg/dL — ABNORMAL LOW (ref 8.9–10.3)
Chloride: 103 mmol/L (ref 98–111)
Creatinine, Ser: 0.78 mg/dL (ref 0.44–1.00)
GFR, Estimated: >60 mL/min (ref >60)
Glucose, Bld: 100 mg/dL — ABNORMAL HIGH (ref 70–99)
Potassium: 3.4 mmol/L — ABNORMAL LOW (ref 3.5–5.1)
Sodium: 135 mmol/L (ref 135–145)
Total Bilirubin: 0.6 mg/dL (ref 0.3–1.2)
Total Protein: 6.5 g/dL (ref 6.5–8.1)

## 2021-09-28 LAB — URINALYSIS, ROUTINE W REFLEX MICROSCOPIC
Bacteria, UA: NONE SEEN
Bilirubin Urine: NEGATIVE
Glucose, UA: NEGATIVE mg/dL
Hgb urine dipstick: NEGATIVE
Ketones, ur: NEGATIVE mg/dL
Leukocytes,Ua: NEGATIVE
Nitrite: NEGATIVE
Protein, ur: NEGATIVE mg/dL
Specific Gravity, Urine: 1.012 (ref 1.005–1.030)
pH: 8 (ref 5.0–8.0)

## 2021-09-28 LAB — I-STAT BETA HCG BLOOD, ED (MC, WL, AP ONLY): I-stat hCG, quantitative: <5 m[IU]/mL (ref <5)

## 2021-09-28 LAB — CBC
HCT: 39.8 % (ref 36.0–46.0)
Hemoglobin: 13.2 g/dL (ref 12.0–15.0)
MCH: 33 pg (ref 26.0–34.0)
MCHC: 33.2 g/dL (ref 30.0–36.0)
MCV: 99.5 fL (ref 80.0–100.0)
Platelets: 287 10*3/uL (ref 150–400)
RBC: 4 MIL/uL (ref 3.87–5.11)
RDW: 13 % (ref 11.5–15.5)
WBC: 4.5 10*3/uL (ref 4.0–10.5)
nRBC: 0 % (ref 0.0–0.2)

## 2021-09-28 LAB — LIPASE, BLOOD: Lipase: 19 U/L (ref 11–51)

## 2021-09-28 MED ORDER — ONDANSETRON 4 MG PO TBDP: 4.0000 mg | ORAL_TABLET | Freq: Three times a day (TID) | ORAL | 0 refills | Status: AC | PRN

## 2021-09-28 NOTE — ED Provider Triage Note (Signed)
Emergency Medicine Provider Triage Evaluation Note ? ?Ruth Allen , a 31 y.o. female  was evaluated in triage.  Pt complains of abdominal pain for the past 2 days.  She says that it feels like cramping.  Localized to the epigastrium and periumbilical area. ? ?Review of Systems  ?Positive: Abdominal pain and nausea ?Negative: Vomiting, diarrhea, fever, chills ? ?Physical Exam  ?BP 135/72   Pulse 67   Temp 98.3 ?F (36.8 ?C) (Oral)   Resp 16   SpO2 100%  ?Gen:   Awake, no distress   ?Resp:  Normal effort  ?MSK:   Moves extremities without difficulty  ?Other:  Tenderness over the epigastrium and umbilicus. ? ?Medical Decision Making  ?Medically screening exam initiated at 9:20 AM.  Appropriate orders placed.  Emmaly Lockwood was informed that the remainder of the evaluation will be completed by another provider, this initial triage assessment does not replace that evaluation, and the importance of remaining in the ED until their evaluation is complete. ? ? ? ?CT deferred ?  ?Rhae Hammock, PA-C ?09/28/21 [id number] ? ?

## 2021-09-28 NOTE — ED Triage Notes (Signed)
Patient complains of umbilical sharp pain with nausea x 3-4 days. Denies fever, no chills. Alert and oriented ?

## 2021-09-28 NOTE — Progress Notes (Signed)
?Virtual Visit Consent  ? ?[name], you are scheduled for a virtual visit with a [facility] provider today. Just as with appointments in the office, your consent must be obtained to participate. Your consent will be active for this visit and any virtual visit you may have with one of our providers in the next 365 days. If you have a MyChart account, a copy of this consent can be sent to you electronically. ? ?As this is a virtual visit, video technology does not allow for your provider to perform a traditional examination. This may limit your provider's ability to fully assess your condition. If your provider identifies any concerns that need to be evaluated in person or the need to arrange testing (such as labs, EKG, etc.), we will make arrangements to do so. Although advances in technology are sophisticated, we cannot ensure that it will always work on either your end or our end. If the connection with a video visit is poor, the visit may have to be switched to a telephone visit. With either a video or telephone visit, we are not always able to ensure that we have a secure connection. ? ?By engaging in this virtual visit, you consent to the provision of healthcare and authorize for your insurance to be billed (if applicable) for the services provided during this visit. Depending on your insurance coverage, you may receive a charge related to this service. ? ?I need to obtain your verbal consent now. Are you willing to proceed with your visit today? Charday Capetillo has provided verbal consent on 09/28/2021 for a virtual visit (video or telephone). [name], PA-C ? ?Date: 09/28/2021 12:23 PM ? ?Virtual Visit via Video Note  ? ?IPiedad Climes, connected with  [name]  ([id number], 10-Aug-1990) on 09/28/21 at 12:15 PM EDT by a video-enabled telemedicine application and verified that I am speaking with the correct person using two identifiers. ? ?Location: ?Patient: Virtual Visit Location Patient:  Home ?Provider: Virtual Visit Location Provider: Home Office ?  ?I discussed the limitations of evaluation and management by telemedicine and the availability of in person appointments. The patient expressed understanding and agreed to proceed.   ? ?History of Present Illness: ?Ruth Allen is a 31 y.o. who identifies as a female who was assigned female at birth, and is being seen today requesting her lab results from ER visit this morning. Patient presented to [facility] ER earlier this morning due to some issues with nausea, bloating and intermittent upper abdominal discomfort for the past 2 days. Denies fever, chills, aches. No vomiting. Denies change to bowel habits. Denies melena, hematochezia or tenesmus. Denies any vaginal/pelvic symptoms. Denies heartburn or reflux. Has noted very poor diet recently and unsure if related. In the ER she had an initial triage with PA noting mild tenderness to epigastric area. Otherwise normal exam and vitals. Orders placed including CBC (no abnormal findings), CMP (borderline low potassium and calcium; otherwise unremarkable), Lipase (normal range), Beta HCG (negative) and UA (unremarkable). Patient notes she had to leave the ER suddenly as the school called her regarding something for her daughter so was not able to stay for full evaluation.  ? ?HPI: HPI  ?Problems:  ?Patient Active Problem List  ? Diagnosis Date Noted  ? SVD 7/23 12/06/2017  ? Postpartum care following vaginal delivery 12/06/2017  ?  ?Allergies: No Known Allergies ?Medications:  ?Current Outpatient Medications:  ?  acetaminophen (TYLENOL) 325 MG tablet, Take 2 tablets (650 mg total) by mouth  every 4 (four) hours as needed (for pain scale < 4  OR  temperature  >/=  100.5 F)., Disp: , Rfl:  ?  benzocaine-Menthol (DERMOPLAST) 20-0.5 % AERO, Apply 1 application topically as needed for irritation (perineal discomfort)., Disp: , Rfl:  ?  coconut oil OIL, Apply 1 application topically as needed., Disp: , Rfl: 0 ?   cyclobenzaprine (FLEXERIL) 10 MG tablet, Take 1 tablet (10 mg total) by mouth 3 (three) times daily as needed for muscle spasms., Disp: 30 tablet, Rfl: 0 ?  lidocaine (LIDODERM) 5 %, Place 1 patch onto the skin daily. Remove & Discard patch within 12 hours or as directed by MD, Disp: 30 patch, Rfl: 0 ?  naproxen (NAPROSYN) 500 MG tablet, Take 1 tablet (500 mg total) by mouth 2 (two) times daily with a meal., Disp: 30 tablet, Rfl: 0 ?  ondansetron (ZOFRAN-ODT) 4 MG disintegrating tablet, Take 1 tablet (4 mg total) by mouth every 8 (eight) hours as needed for nausea or vomiting., Disp: 8 tablet, Rfl: 0 ?  predniSONE (DELTASONE) 20 MG tablet, Take 2 tablets (40 mg total) by mouth daily., Disp: 20 tablet, Rfl: 0 ?  Prenatal Vit-Fe Fumarate-FA (PRENATAL MULTIVITAMIN) TABS tablet, Take 1 tablet by mouth daily at 12 noon., Disp: , Rfl:  ?  tizanidine (ZANAFLEX) 2 MG capsule, Take 1 capsule (2 mg total) by mouth 3 (three) times daily., Disp: 15 capsule, Rfl: 0 ? ?Observations/Objective: ?Patient is well-developed, well-nourished in no acute distress.  ?Resting comfortably at home.  ?Head is normocephalic, atraumatic.  ?No labored breathing. ?Speech is clear and coherent with logical content.  ?Patient is alert and oriented at baseline.  ? ?Assessment and Plan: ?1. Abdominal bloating ?2. Nausea ? ?Negative lab workup in the ER. No alarm signs or symptoms present. Suspect bloating and nausea from silent reflux secondary to poor diet. Increase fluids. Start probiotic. GERD diet recommended -- handout given. Will start Famotidine OTC nightly. Zofran ODT per orders. Close in-person follow-up discussed.  ? ?Follow Up Instructions: ?I discussed the assessment and treatment plan with the patient. The patient was provided an opportunity to ask questions and all were answered. The patient agreed with the plan and demonstrated an understanding of the instructions.  A copy of instructions were sent to the patient via MyChart unless  otherwise noted below.  ? ?The patient was advised to call back or seek an in-person evaluation if the symptoms worsen or if the condition fails to improve as anticipated. ? ?Time:  ?I spent 15 minutes with the patient via telehealth technology discussing the above problems/concerns.   ? ?[name], PA-C ?

## 2021-09-28 NOTE — ED Notes (Signed)
Pt left. 

## 2021-09-28 NOTE — Patient Instructions (Signed)
Ruth FittingNykia Allen, thank you for joining Ruth ClimesWilliam Cody Krystan Northrop, PA-C for today's virtual visit.  While this provider is not your primary care provider (PCP), if your PCP is located in our provider database this encounter information will be shared with them immediately following your visit. ? ?Consent: ?(Patient) Ruth Fittingykia Ruth Allen provided verbal consent for this virtual visit at the beginning of the encounter. ? ?Current Medications: ? ?Current Outpatient Medications:  ?  acetaminophen (TYLENOL) 325 MG tablet, Take 2 tablets (650 mg total) by mouth every 4 (four) hours as needed (for pain scale < 4  OR  temperature  >/=  100.5 F)., Disp: , Rfl:  ?  benzocaine-Menthol (DERMOPLAST) 20-0.5 % AERO, Apply 1 application topically as needed for irritation (perineal discomfort)., Disp: , Rfl:  ?  coconut oil OIL, Apply 1 application topically as needed., Disp: , Rfl: 0 ?  cyclobenzaprine (FLEXERIL) 10 MG tablet, Take 1 tablet (10 mg total) by mouth 3 (three) times daily as needed for muscle spasms., Disp: 30 tablet, Rfl: 0 ?  lidocaine (LIDODERM) 5 %, Place 1 patch onto the skin daily. Remove & Discard patch within 12 hours or as directed by MD, Disp: 30 patch, Rfl: 0 ?  naproxen (NAPROSYN) 500 MG tablet, Take 1 tablet (500 mg total) by mouth 2 (two) times daily with a meal., Disp: 30 tablet, Rfl: 0 ?  ondansetron (ZOFRAN-ODT) 4 MG disintegrating tablet, Take 1 tablet (4 mg total) by mouth every 8 (eight) hours as needed for nausea or vomiting., Disp: 8 tablet, Rfl: 0 ?  predniSONE (DELTASONE) 20 MG tablet, Take 2 tablets (40 mg total) by mouth daily., Disp: 20 tablet, Rfl: 0 ?  Prenatal Vit-Fe Fumarate-FA (PRENATAL MULTIVITAMIN) TABS tablet, Take 1 tablet by mouth daily at 12 noon., Disp: , Rfl:  ?  tizanidine (ZANAFLEX) 2 MG capsule, Take 1 capsule (2 mg total) by mouth 3 (three) times daily., Disp: 15 capsule, Rfl: 0  ? ?Medications ordered in this encounter:  ?No orders of the defined types were placed in this encounter. ?  ? ?*If  you need refills on other medications prior to your next appointment, please contact your pharmacy* ? ?Follow-Up: ?Call back or seek an in-person evaluation if the symptoms worsen or if the condition fails to improve as anticipated. ? ?Other Instructions ?Keep hydrated. ?Follow the dietary recommendations listed below. ?Take an OTC Pepcid (Famotidine) daily for the next few days.  ?Start a daily probiotic. ?Use the Zofran I have sent in for you to help with any nausea.  ? ?If symptoms are not resolving or you note new or worsening symptoms, please be evaluated in person as discussed.  ? ?Food Choices for Gastroesophageal Reflux Disease, Adult ?When you have gastroesophageal reflux disease (GERD), the foods you eat and your eating habits are very important. Choosing the right foods can help ease your discomfort. Think about working with a food expert (dietitian) to help you make good choices. ?What are tips for following this plan? ?Reading food labels ?Look for foods that are low in saturated fat. Foods that may help with your symptoms include: ?Foods that have less than 5% of daily value (DV) of fat. ?Foods that have 0 grams of trans fat. ?Cooking ?Do not fry your food. ?Cook your food by baking, steaming, grilling, or broiling. These are all methods that do not need a lot of fat for cooking. ?To add flavor, try to use herbs that are low in spice and acidity. ?Meal planning ? ?Choose healthy foods  that are low in fat, such as: ?Fruits and vegetables. ?Whole grains. ?Low-fat dairy products. ?Lean meats, fish, and poultry. ?Eat small meals often instead of eating 3 large meals each day. Eat your meals slowly in a place where you are relaxed. Avoid bending over or lying down until 2-3 hours after eating. ?Limit high-fat foods such as fatty meats or fried foods. ?Limit your intake of fatty foods, such as oils, butter, and shortening. ?Avoid the following as told by your doctor: ?Foods that cause symptoms. These may be  different for different people. Keep a food diary to keep track of foods that cause symptoms. ?Alcohol. ?Drinking a lot of liquid with meals. ?Eating meals during the 2-3 hours before bed. ?Lifestyle ?Stay at a healthy weight. Ask your doctor what weight is healthy for you. If you need to lose weight, work with your doctor to do so safely. ?Exercise for at least 30 minutes on 5 or more days each week, or as told by your doctor. ?Wear loose-Allen clothes. ?Do not smoke or use any products that contain nicotine or tobacco. If you need help quitting, ask your doctor. ?Sleep with the head of your bed higher than your feet. Use a wedge under the mattress or blocks under the bed frame to raise the head of the bed. ?Chew sugar-free gum after meals. ?What foods should eat? ? ?Eat a healthy, well-balanced diet of fruits, vegetables, whole grains, low-fat dairy products, lean meats, fish, and poultry. Each person is different. ?Foods that may cause symptoms in one person may not cause any symptoms in another person. Work with your doctor to find foods that are safe for you. ?The items listed above may not be a complete list of what you can eat and drink. Contact a food expert for more options. ?What foods should I avoid? ?Limiting some of these foods may help in managing the symptoms of GERD. Everyone is different. Talk with a food expert or your doctor to help you find the exact foods to avoid, if any. ?Fruits ?Any fruits prepared with added fat. Any fruits that cause symptoms. For some people, this may include citrus fruits, such as oranges, grapefruit, pineapple, and lemons. ?Vegetables ?Deep-fried vegetables. [place] fries. Any vegetables prepared with added fat. Any vegetables that cause symptoms. For some people, this may include tomatoes and tomato products, chili peppers, onions and garlic, and horseradish. ?Grains ?Pastries or quick breads with added fat. ?Meats and other proteins ?High-fat meats, such as fatty beef  or pork, hot dogs, ribs, ham, sausage, salami, and bacon. Fried meat or protein, including fried fish and fried chicken. Nuts and nut butters, in large amounts. ?Dairy ?Whole milk and chocolate milk. Sour cream. Cream. Ice cream. Cream cheese. Milkshakes. ?Fats and oils ?Butter. Margarine. Shortening. Ghee. ?Beverages ?Coffee and tea, with or without caffeine. Carbonated beverages. Sodas. Energy drinks. Fruit juice made with acidic fruits, such as orange or grapefruit. Tomato juice. Alcoholic drinks. ?Sweets and desserts ?Chocolate and cocoa. Donuts. ?Seasonings and condiments ?Pepper. Peppermint and spearmint. Added salt. Any condiments, herbs, or seasonings that cause symptoms. For some people, this may include curry, hot sauce, or vinegar-based salad dressings. ?The items listed above may not be a complete list of what you should not eat and drink. Contact a food expert for more options. ?Questions to ask your doctor ?Diet and lifestyle changes are often the first steps that are taken to manage symptoms of GERD. If diet and lifestyle changes do not help, talk with your doctor  about taking medicines. ?Where to find more information ?International Foundation for Gastrointestinal Disorders: aboutgerd.org ?Summary ?When you have GERD, food and lifestyle choices are very important in easing your symptoms. ?Eat small meals often instead of 3 large meals a day. Eat your meals slowly and in a place where you are relaxed. ?Avoid bending over or lying down until 2-3 hours after eating. ?Limit high-fat foods such as fatty meats or fried foods. ?This information is not intended to replace advice given to you by your health care provider. Make sure you discuss any questions you have with your health care provider. ?Document Revised: 11/12/2019 Document Reviewed: 11/12/2019 ?Elsevier Patient Education ? 2023 Elsevier Inc. ? ? ? ?If you have been instructed to have an in-person evaluation today at a local Urgent Care facility,  please use the link below. It will take you to a list of all of our available [facility], including address, phone number and hours of operation. Please do not delay care.  ?Kim[place]ly

## 2022-03-29 ENCOUNTER — Telehealth: Payer: Self-pay | Admitting: Nurse Practitioner

## 2022-03-29 ENCOUNTER — Other Ambulatory Visit (INDEPENDENT_AMBULATORY_CARE_PROVIDER_SITE_OTHER): Payer: Self-pay | Admitting: Physician Assistant

## 2022-03-29 DIAGNOSIS — M542 Cervicalgia: Secondary | ICD-10-CM

## 2022-03-29 DIAGNOSIS — M545 Low back pain, unspecified: Secondary | ICD-10-CM

## 2022-03-29 MED ORDER — CYCLOBENZAPRINE HCL 10 MG PO TABS: 10.0000 mg | ORAL_TABLET | Freq: Three times a day (TID) | ORAL | 0 refills | Status: AC | PRN

## 2022-03-29 MED ORDER — NAPROXEN 500 MG PO TABS: 500.0000 mg | ORAL_TABLET | Freq: Two times a day (BID) | ORAL | 0 refills | Status: AC

## 2022-03-29 NOTE — Progress Notes (Signed)
We are sorry that you are not feeling well.  Here is how we plan to help!  Based on what you have shared with me it looks like you mostly have acute back pain.  Acute back pain is defined as musculoskeletal pain that can resolve in 1-3 weeks with conservative treatment.  I have prescribed Naprosyn 500 mg take one by mouth twice a day non-steroid anti-inflammatory (NSAID). You should not take any other NSAIDs (including ibuprofen, advil, motrin) while using Naprosyn. Be sure to take that medication with food.  We will also prescribe Flexeril 10 mg every eight hours as needed which is a muscle relaxer. You should only use the muscle relaxer before bed, or if you are staying home for the day as it causes drowsiness.     Some patients experience stomach irritation or in increased heartburn with anti-inflammatory drugs.  Please keep in mind that muscle relaxer's can cause fatigue and should not be taken while at work or driving.  Back pain is very common.  The pain often gets better over time.  The cause of back pain is usually not dangerous.  Most people can learn to manage their back pain on their own.  Home Care Stay active.  Start with short walks on flat ground if you can.  Try to walk farther each day. Do not sit, drive or stand in one place for more than 30 minutes.  Do not stay in bed. Do not avoid exercise or work.  Activity can help your back heal faster. Be careful when you bend or lift an object.  Bend at your knees, keep the object close to you, and do not twist. Sleep on a firm mattress.  Lie on your side, and bend your knees.  If you lie on your back, put a pillow under your knees. Only take medicines as told by your doctor. Put ice on the injured area. Put ice in a plastic bag Place a towel between your skin and the bag Leave the ice on for 15-20 minutes, 3-4 times a day for the first 2-3 days. 210 After that, you can switch between ice and heat packs. Ask your doctor about back  exercises or massage. Avoid feeling anxious or stressed.  Find good ways to deal with stress, such as exercise.  Get Help Right Way If: Your pain does not go away with rest or medicine. Your pain does not go away in 1 week. You have new problems. You do not feel well. The pain spreads into your legs. You cannot control when you poop (bowel movement) or pee (urinate) You feel sick to your stomach (nauseous) or throw up (vomit) You have belly (abdominal) pain. You feel like you may pass out (faint). If you develop a fever.  Make Sure you: Understand these instructions. Will watch your condition Will get help right away if you are not doing well or get worse.  Your e-visit answers were reviewed by a board certified advanced clinical practitioner to complete your personal care plan.  Depending on the condition, your plan could have included both over the counter or prescription medications.  If there is a problem please reply  once you have received a response from your provider.  Your safety is important to [place].  If you have drug allergies check your prescription carefully.    You can use MyChart to ask questions about today's visit, request a non-urgent call back, or ask for a work or school excuse for 24 hours  related to this e-Visit. If it has been greater than 24 hours you will need to follow up with your provider, or enter a new e-Visit to address those concerns.  You will get an e-mail in the next two days asking about your experience.  I hope that your e-visit has been valuable and will speed your recovery. Thank you for using e-visits.   Meds ordered this encounter  Medications   naproxen (NAPROSYN) 500 MG tablet    Sig: Take 1 tablet (500 mg total) by mouth 2 (two) times daily with a meal for 10 days.    Dispense:  20 tablet    Refill:  0   cyclobenzaprine (FLEXERIL) 10 MG tablet    Sig: Take 1 tablet (10 mg total) by mouth 3 (three) times daily as needed for muscle spasms.     Dispense:  30 tablet    Refill:  0     I spent approximately 5 minutes reviewing the patient's history, current symptoms and coordinating their plan of care today.

## 2022-07-23 ENCOUNTER — Other Ambulatory Visit (INDEPENDENT_AMBULATORY_CARE_PROVIDER_SITE_OTHER): Payer: Self-pay | Admitting: Physician Assistant

## 2022-07-23 ENCOUNTER — Other Ambulatory Visit (INDEPENDENT_AMBULATORY_CARE_PROVIDER_SITE_OTHER): Payer: Self-pay | Admitting: Nurse Practitioner

## 2022-07-23 DIAGNOSIS — M542 Cervicalgia: Secondary | ICD-10-CM

## 2022-07-23 DIAGNOSIS — M545 Low back pain, unspecified: Secondary | ICD-10-CM

## 2022-07-24 ENCOUNTER — Telehealth: Payer: Medicaid Other | Admitting: Nurse Practitioner

## 2022-07-24 DIAGNOSIS — M545 Low back pain, unspecified: Secondary | ICD-10-CM

## 2022-07-24 MED ORDER — CYCLOBENZAPRINE HCL 10 MG PO TABS: 10.0000 mg | ORAL_TABLET | Freq: Three times a day (TID) | ORAL | 0 refills | Status: AC | PRN

## 2022-07-24 MED ORDER — NAPROXEN 500 MG PO TABS: 500.0000 mg | ORAL_TABLET | Freq: Two times a day (BID) | ORAL | 0 refills | Status: AC

## 2022-07-24 NOTE — Progress Notes (Signed)

## 2022-07-26 ENCOUNTER — Telehealth: Payer: Medicaid Other | Admitting: Physician Assistant

## 2022-07-26 DIAGNOSIS — M549 Dorsalgia, unspecified: Secondary | ICD-10-CM

## 2022-07-26 MED ORDER — METHYLPREDNISOLONE 4 MG PO TBPK: ORAL_TABLET | ORAL | 0 refills | Status: DC

## 2022-07-26 NOTE — Patient Instructions (Signed)
Ruth Allen, thank you for joining Mar Daring, PA-C for today's virtual visit.  While this provider is not your primary care provider (PCP), if your PCP is located in our provider database this encounter information will be shared with them immediately following your visit.   [facility] account gives you access to today's visit and all your visits, tests, and labs performed at [facility] " click here if you don't have a [facility] account or go to [email or web address]  Consent: (Patient) Ruth Allen provided verbal consent for this virtual visit at the beginning of the encounter.  Current Medications:  Current Outpatient Medications:    methylPREDNISolone (MEDROL DOSEPAK) 4 MG TBPK tablet, 6 day taper; take as directed on package instructions, Disp: 21 tablet, Rfl: 0   acetaminophen (TYLENOL) 325 MG tablet, Take 2 tablets (650 mg total) by mouth every 4 (four) hours as needed (for pain scale < 4  OR  temperature  >/=  100.5 F)., Disp: , Rfl:    benzocaine-Menthol (DERMOPLAST) 20-0.5 % AERO, Apply 1 application topically as needed for irritation (perineal discomfort)., Disp: , Rfl:    coconut oil OIL, Apply 1 application topically as needed., Disp: , Rfl: 0   cyclobenzaprine (FLEXERIL) 10 MG tablet, Take 1 tablet (10 mg total) by mouth 3 (three) times daily as needed for muscle spasms., Disp: 30 tablet, Rfl: 0   cyclobenzaprine (FLEXERIL) 10 MG tablet, Take 1 tablet (10 mg total) by mouth 3 (three) times daily as needed for muscle spasms., Disp: 30 tablet, Rfl: 0   lidocaine (LIDODERM) 5 %, Place 1 patch onto the skin daily. Remove & Discard patch within 12 hours or as directed by MD, Disp: 30 patch, Rfl: 0   naproxen (NAPROSYN) 500 MG tablet, Take 1 tablet (500 mg total) by mouth 2 (two) times daily with a meal., Disp: 30 tablet, Rfl: 0   ondansetron (ZOFRAN-ODT) 4 MG disintegrating tablet, Take 1 tablet (4 mg total) by mouth every 8 (eight) hours as  needed for nausea or vomiting., Disp: 20 tablet, Rfl: 0   Prenatal Vit-Fe Fumarate-FA (PRENATAL MULTIVITAMIN) TABS tablet, Take 1 tablet by mouth daily at 12 noon., Disp: , Rfl:    tizanidine (ZANAFLEX) 2 MG capsule, Take 1 capsule (2 mg total) by mouth 3 (three) times daily., Disp: 15 capsule, Rfl: 0   Medications ordered in this encounter:  Meds ordered this encounter  Medications   methylPREDNISolone (MEDROL DOSEPAK) 4 MG TBPK tablet    Sig: 6 day taper; take as directed on package instructions    Dispense:  21 tablet    Refill:  0    Order Specific Question:   Supervising Provider    Answer:   Chase Picket [id number]     *If you need refills on other medications prior to your next appointment, please contact your pharmacy*  Follow-Up: Call back or seek an in-person evaluation if the symptoms worsen or if the condition fails to improve as anticipated.  [place] [phone]  Other Instructions  [facility] [address], [place], [place] [address] URGENT CARE HOURS Monday - Friday: 8:00am to 8:00pm Saturday: 10:00am to 3:00pm [id number]    If you have been instructed to have an in-person evaluation today at a local Urgent Care facility, please use the link below. It will take you to a list of all of our available [facility], including address, phone number and hours of operation. Please do not delay  care.  [facility]  If you or a family member do not have a primary care provider, use the link below to schedule a visit and establish care. When you choose a [facility] primary care physician or advanced practice provider, you gain a long-term partner in health. Find a Primary Care Provider  Learn more about [facility]'s in-office and virtual care options: [facility]

## 2022-07-26 NOTE — Progress Notes (Signed)
Virtual Visit Consent   Ruth Allen, you are scheduled for a virtual visit with a [facility] provider today. Just as with appointments in the office, your consent must be obtained to participate. Your consent will be active for this visit and any virtual visit you may have with one of our providers in the next 365 days. If you have a MyChart account, a copy of this consent can be sent to you electronically.  As this is a virtual visit, video technology does not allow for your provider to perform a traditional examination. This may limit your provider's ability to fully assess your condition. If your provider identifies any concerns that need to be evaluated in person or the need to arrange testing (such as labs, EKG, etc.), we will make arrangements to do so. Although advances in technology are sophisticated, we cannot ensure that it will always work on either your end or our end. If the connection with a video visit is poor, the visit may have to be switched to a telephone visit. With either a video or telephone visit, we are not always able to ensure that we have a secure connection.  By engaging in this virtual visit, you consent to the provision of healthcare and authorize for your insurance to be billed (if applicable) for the services provided during this visit. Depending on your insurance coverage, you may receive a charge related to this service.  I need to obtain your verbal consent now. Are you willing to proceed with your visit today? Talulah Fiel has provided verbal consent on 07/26/2022 for a virtual visit (video or telephone). Mar Daring, PA-C  Date: 07/26/2022 6:03 PM  Virtual Visit via Video Note   I, Mar Daring, connected with  Ruth Allen  ([id number], 04-14-1991) on 07/26/22 at  6:00 PM EDT by a video-enabled telemedicine application and verified that I am speaking with the correct person using two identifiers.  Location: Patient: Virtual Visit Location Patient:  Mobile Provider: Virtual Visit Location Provider: Home Office   I discussed the limitations of evaluation and management by telemedicine and the availability of in person appointments. The patient expressed understanding and agreed to proceed.    History of Present Illness: Ruth Allen is a 32 y.o. who identifies as a female who was assigned female at birth, and is being seen today for continued upper back pain. Completed an EV on 07/24/22 and was given Flexeril and Naproxen. Still having pain in the neck (left sided) and radiculopathy in the left posterior arm. Has been going to the chiropractor but unable to do anything today because muscles had spasms and unable to move. She feels it is worsened by her positioning at work. She has never seen orthopedics for this.    Problems:  Patient Active Problem List   Diagnosis Date Noted   SVD 7/23 12/06/2017   Postpartum care following vaginal delivery 12/06/2017    Allergies: No Known Allergies Medications:  Current Outpatient Medications:    methylPREDNISolone (MEDROL DOSEPAK) 4 MG TBPK tablet, 6 day taper; take as directed on package instructions, Disp: 21 tablet, Rfl: 0   acetaminophen (TYLENOL) 325 MG tablet, Take 2 tablets (650 mg total) by mouth every 4 (four) hours as needed (for pain scale < 4  OR  temperature  >/=  100.5 F)., Disp: , Rfl:    benzocaine-Menthol (DERMOPLAST) 20-0.5 % AERO, Apply 1 application topically as needed for irritation (perineal discomfort)., Disp: , Rfl:    coconut oil OIL, Apply  1 application topically as needed., Disp: , Rfl: 0   cyclobenzaprine (FLEXERIL) 10 MG tablet, Take 1 tablet (10 mg total) by mouth 3 (three) times daily as needed for muscle spasms., Disp: 30 tablet, Rfl: 0   cyclobenzaprine (FLEXERIL) 10 MG tablet, Take 1 tablet (10 mg total) by mouth 3 (three) times daily as needed for muscle spasms., Disp: 30 tablet, Rfl: 0   lidocaine (LIDODERM) 5 %, Place 1 patch onto the skin daily. Remove & Discard  patch within 12 hours or as directed by MD, Disp: 30 patch, Rfl: 0   naproxen (NAPROSYN) 500 MG tablet, Take 1 tablet (500 mg total) by mouth 2 (two) times daily with a meal., Disp: 30 tablet, Rfl: 0   ondansetron (ZOFRAN-ODT) 4 MG disintegrating tablet, Take 1 tablet (4 mg total) by mouth every 8 (eight) hours as needed for nausea or vomiting., Disp: 20 tablet, Rfl: 0   Prenatal Vit-Fe Fumarate-FA (PRENATAL MULTIVITAMIN) TABS tablet, Take 1 tablet by mouth daily at 12 noon., Disp: , Rfl:    tizanidine (ZANAFLEX) 2 MG capsule, Take 1 capsule (2 mg total) by mouth 3 (three) times daily., Disp: 15 capsule, Rfl: 0  Observations/Objective: Patient is well-developed, well-nourished in no acute distress.  Resting comfortably  Head is normocephalic, atraumatic.  No labored breathing.  Speech is clear and coherent with logical content.  Patient is alert and oriented at baseline.    Assessment and Plan: 1. Upper back pain on left side - methylPREDNISolone (MEDROL DOSEPAK) 4 MG TBPK tablet; 6 day taper; take as directed on package instructions  Dispense: 21 tablet; Refill: 0  - Hold Naproxen - Add Medrol - Continue Flexeril - Heat - Massage - Can use tylenol with Medrol - Seek in person evaluation at [facility] if symptoms continue to worsen or fail to improve  Follow Up Instructions: I discussed the assessment and treatment plan with the patient. The patient was provided an opportunity to ask questions and all were answered. The patient agreed with the plan and demonstrated an understanding of the instructions.  A copy of instructions were sent to the patient via MyChart unless otherwise noted below.    The patient was advised to call back or seek an in-person evaluation if the symptoms worsen or if the condition fails to improve as anticipated.  Time:  I spent 12 minutes with the patient via telehealth technology discussing the above problems/concerns.    Mar Daring,  PA-C

## 2022-07-29 ENCOUNTER — Other Ambulatory Visit: Payer: Self-pay | Admitting: Physician Assistant

## 2022-07-29 DIAGNOSIS — M549 Dorsalgia, unspecified: Secondary | ICD-10-CM

## 2022-07-30 ENCOUNTER — Telehealth: Payer: Medicaid Other | Admitting: Family Medicine

## 2022-07-30 ENCOUNTER — Other Ambulatory Visit (INDEPENDENT_AMBULATORY_CARE_PROVIDER_SITE_OTHER): Payer: Self-pay | Admitting: Physician Assistant

## 2022-07-30 ENCOUNTER — Emergency Department (HOSPITAL_COMMUNITY)
Admission: EM | Admit: 2022-07-30 | Discharge: 2022-07-31 | Disposition: A | Discharge status: Home / Self Care | Payer: Medicaid Other | Attending: Emergency Medicine | Admitting: Emergency Medicine

## 2022-07-30 ENCOUNTER — Other Ambulatory Visit: Payer: Self-pay

## 2022-07-30 ENCOUNTER — Encounter (HOSPITAL_COMMUNITY): Payer: Self-pay

## 2022-07-30 DIAGNOSIS — M549 Dorsalgia, unspecified: Secondary | ICD-10-CM

## 2022-07-30 DIAGNOSIS — M5412 Radiculopathy, cervical region: Secondary | ICD-10-CM

## 2022-07-30 DIAGNOSIS — M62838 Other muscle spasm: Secondary | ICD-10-CM | POA: Diagnosis not present

## 2022-07-30 DIAGNOSIS — M542 Cervicalgia: Secondary | ICD-10-CM | POA: Diagnosis not present

## 2022-07-30 MED ORDER — METHOCARBAMOL 500 MG PO TABS: 500.0000 mg | ORAL_TABLET | Freq: Four times a day (QID) | ORAL | 0 refills | Status: AC

## 2022-07-30 NOTE — Patient Instructions (Signed)
Muscle Cramps and Spasms Muscle cramps and spasms occur when a muscle or muscles tighten and you have no control over this tightening (involuntary muscle contraction). They are a common problem that can happen in any muscle. The most common place is in the calf muscles of the leg. There are a few ways that muscle cramps and spasms differ: Muscle cramps are painful. They come and go and may last for a few seconds or up to 15 minutes. Muscle cramps are often more forceful and last longer than muscle spasms. Muscle spasms may or may not be painful. They may last just a few seconds or last much longer. Certain conditions, such as diabetes or Parkinson's disease, can make you more likely to have cramps or spasms. But in most cases, cramps and spasms are not caused by other conditions. Common causes include: Overexertion. This is when you do more physical work or exercise than your body is ready for. Overuse from doing the same movements too many times. Staying in one position for too long. Improper preparation, form, or technique when playing a sport or doing an activity. Not enough water or other fluids in your body (dehydration). Other causes may include: Injury. Side effects of some medicines. Too few salts and minerals in your body (electrolytes), such as potassium and calcium. This could happen if you are taking water pills (diuretics) or if you are pregnant. In many cases, the cause of muscle cramps or spasms is not known. Follow these instructions at home: Eating and drinking Drink enough fluid to keep your pee (urine) pale yellow. This can help prevent cramps or spasms. Eat a healthy diet that includes a lot of nutrients to help your muscles work. A healthy diet includes fruits and vegetables, lean protein, whole grains, and low-fat or nonfat dairy products. Managing pain and stiffness     Try to massage, stretch, and relax the affected muscle. Do this for a few minutes at a time. If told,  put ice on the muscles. This may help if you are sore or have pain after a cramp or spasm. Put ice in a plastic bag. Place a towel between your skin and the bag. Leave the ice on for 20 minutes, 2-3 times a day. If told, apply heat to tight or tense muscles as often as told by your health care provider. Use the heat source that your provider recommends, such as a moist heat pack or a heating pad. Place a towel between your skin and the heat source. Leave the heat on for 20-30 minutes. If your skin turns bright red, remove the ice or heat right away to prevent skin damage. The risk of damage is higher if you cannot feel pain, heat, or cold. Take hot showers or baths to help relax tight muscles. General instructions If you are having cramps often, avoid intense exercise for a few days. Take over-the-counter and prescription medicines only as told by your provider. Watch for any changes in your symptoms. Contact a health care provider if: Your cramps or spasms get more severe or happen more often. Your cramps or spasms do not get better over time. This information is not intended to replace advice given to you by your health care provider. Make sure you discuss any questions you have with your health care provider. Document Revised: 12/22/2021 Document Reviewed: 12/22/2021 Elsevier Patient Education  2023 Elsevier Inc.  

## 2022-07-30 NOTE — ED Triage Notes (Signed)
Left upper arm pain and back spasms x 1 week. Picked up prescribed robaxin and naproxen tonight and took meds ~8PM.   Limited ROM. 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

## 2022-07-30 NOTE — Progress Notes (Signed)
Virtual Visit Consent   [name], you are scheduled for a virtual visit with a [facility] provider today. Just as with appointments in the office, your consent must be obtained to participate. Your consent will be active for this visit and any virtual visit you may have with one of our providers in the next 365 days. If you have a MyChart account, a copy of this consent can be sent to you electronically.  As this is a virtual visit, video technology does not allow for your provider to perform a traditional examination. This may limit your provider's ability to fully assess your condition. If your provider identifies any concerns that need to be evaluated in person or the need to arrange testing (such as labs, EKG, etc.), we will make arrangements to do so. Although advances in technology are sophisticated, we cannot ensure that it will always work on either your end or our end. If the connection with a video visit is poor, the visit may have to be switched to a telephone visit. With either a video or telephone visit, we are not always able to ensure that we have a secure connection.  By engaging in this virtual visit, you consent to the provision of healthcare and authorize for your insurance to be billed (if applicable) for the services provided during this visit. Depending on your insurance coverage, you may receive a charge related to this service.  I need to obtain your verbal consent now. Are you willing to proceed with your visit today? [name] has provided verbal consent on 07/30/2022 for a virtual visit (video or telephone). Dellia Nims, FNP  Date: 07/30/2022 7:32 PM  Virtual Visit via Video Note   I, Dellia Nims, connected with  Ruth Allen  ([id number], 09-19-1990) on 07/30/22 at  7:30 PM EDT by a video-enabled telemedicine application and verified that I am speaking with the correct person using two identifiers.  Location: Patient: Virtual Visit Location Patient: Home Provider:  Virtual Visit Location Provider: Home Office   I discussed the limitations of evaluation and management by telemedicine and the availability of in person appointments. The patient expressed understanding and agreed to proceed.    History of Present Illness: Ruth Allen is a 32 y.o. who identifies as a female who was assigned female at birth, and is being seen today for spasms in left arm, requests a refill of methocarbamol which has worked well in the past. .  HPI: HPI  Problems:  Patient Active Problem List   Diagnosis Date Noted   SVD 7/23 12/06/2017   Postpartum care following vaginal delivery 12/06/2017    Allergies: No Known Allergies Medications:  Current Outpatient Medications:    methocarbamol (ROBAXIN) 500 MG tablet, Take 1 tablet (500 mg total) by mouth 4 (four) times daily for 7 days., Disp: 28 tablet, Rfl: 0   acetaminophen (TYLENOL) 325 MG tablet, Take 2 tablets (650 mg total) by mouth every 4 (four) hours as needed (for pain scale < 4  OR  temperature  >/=  100.5 F)., Disp: , Rfl:    benzocaine-Menthol (DERMOPLAST) 20-0.5 % AERO, Apply 1 application topically as needed for irritation (perineal discomfort)., Disp: , Rfl:    coconut oil OIL, Apply 1 application topically as needed., Disp: , Rfl: 0   cyclobenzaprine (FLEXERIL) 10 MG tablet, Take 1 tablet (10 mg total) by mouth 3 (three) times daily as needed for muscle spasms., Disp: 30 tablet, Rfl: 0   cyclobenzaprine (FLEXERIL) 10 MG tablet, Take 1  tablet (10 mg total) by mouth 3 (three) times daily as needed for muscle spasms., Disp: 30 tablet, Rfl: 0   lidocaine (LIDODERM) 5 %, Place 1 patch onto the skin daily. Remove & Discard patch within 12 hours or as directed by MD, Disp: 30 patch, Rfl: 0   methylPREDNISolone (MEDROL DOSEPAK) 4 MG TBPK tablet, 6 day taper; take as directed on package instructions, Disp: 21 tablet, Rfl: 0   naproxen (NAPROSYN) 500 MG tablet, Take 1 tablet (500 mg total) by mouth 2 (two) times daily with a  meal., Disp: 30 tablet, Rfl: 0   ondansetron (ZOFRAN-ODT) 4 MG disintegrating tablet, Take 1 tablet (4 mg total) by mouth every 8 (eight) hours as needed for nausea or vomiting., Disp: 20 tablet, Rfl: 0   Prenatal Vit-Fe Fumarate-FA (PRENATAL MULTIVITAMIN) TABS tablet, Take 1 tablet by mouth daily at 12 noon., Disp: , Rfl:    tizanidine (ZANAFLEX) 2 MG capsule, Take 1 capsule (2 mg total) by mouth 3 (three) times daily., Disp: 15 capsule, Rfl: 0  Observations/Objective: Patient is well-developed, well-nourished in no acute distress.  Resting comfortably in parked car.  Head is normocephalic, atraumatic.  No labored breathing.  Speech is clear and coherent with logical content.  Patient is alert and oriented at baseline.    Assessment and Plan: 1. Muscle spasm  Heat, med use and side effects discussed, UC if sx worsen.   Follow Up Instructions: I discussed the assessment and treatment plan with the patient. The patient was provided an opportunity to ask questions and all were answered. The patient agreed with the plan and demonstrated an understanding of the instructions.  A copy of instructions were sent to the patient via MyChart unless otherwise noted below.     The patient was advised to call back or seek an in-person evaluation if the symptoms worsen or if the condition fails to improve as anticipated.  Time:  I spent 10 minutes with the patient via telehealth technology discussing the above problems/concerns.    Dellia Nims, FNP

## 2022-07-31 ENCOUNTER — Emergency Department (HOSPITAL_COMMUNITY): Payer: Medicaid Other

## 2022-07-31 DIAGNOSIS — M542 Cervicalgia: Secondary | ICD-10-CM | POA: Diagnosis not present

## 2022-07-31 MED ORDER — PREDNISONE 20 MG PO TABS
60.0000 mg | ORAL_TABLET | Freq: Once | ORAL | Status: AC
  Administered 2022-07-31: 60 mg via ORAL
  Filled 2022-07-31: qty 3

## 2022-07-31 MED ORDER — OXYCODONE-ACETAMINOPHEN 5-325 MG PO TABS
1.0000 | ORAL_TABLET | Freq: Once | ORAL | Status: AC
  Administered 2022-07-31: 1 via ORAL
  Filled 2022-07-31: qty 1

## 2022-07-31 MED ORDER — OXYCODONE-ACETAMINOPHEN 5-325 MG PO TABS: 1.0000 | ORAL_TABLET | ORAL | 0 refills | Status: DC | PRN

## 2022-07-31 MED ORDER — PREDNISONE 20 MG PO TABS: ORAL_TABLET | ORAL | 0 refills | Status: DC

## 2022-07-31 NOTE — ED Notes (Signed)
Ice pack given

## 2022-07-31 NOTE — Discharge Instructions (Signed)
Take the prescribed medication as directed.  Use caution when driving, medication can make you drowsy. Follow-up with primary care doctor.  I have attached list of offices on the back or you can call number on back of your insurance card for assistance. Return to the ED for new or worsening symptoms.

## 2022-07-31 NOTE — ED Provider Notes (Signed)
[facility] Provider Note   CSN: [id number] Arrival date & time: 07/30/22  2208     History  Chief Complaint  Patient presents with   Arm Pain    Ruth Allen is a 32 y.o. female.  The history is provided by the patient and medical records.  Arm Pain   32 year old female presenting to the ED with neck and left arm pain.  Reports this has been ongoing for about a week now.  She does have longstanding history of back issues and sees a chiropractor regularly for this.  She did see him over the past week, however no adjustments made to the neck as she was having trouble getting into position.  States since symptoms began she now has some tingling down the left arm to the hand.  She is not having any focal weakness or numbness.  She was previously on prednisone which seemed to help a little bit, now taking naproxen and Robaxin without much change.  She has never had any neck injuries or surgeries in the past.  She does have a fairly physical job and does a lot of lifting throughout the day.  Home Medications Prior to Admission medications   Medication Sig Start Date End Date Taking? Authorizing Provider  oxyCODONE-acetaminophen (PERCOCET) 5-325 MG tablet Take 1 tablet by mouth every 4 (four) hours as needed. 07/31/22  Yes Larene Pickett, PA-C  predniSONE (DELTASONE) 20 MG tablet Take 40 mg by mouth daily for 3 days, then 20mg  by mouth daily for 3 days, then 10mg  daily for 3 days 07/31/22  Yes Larene Pickett, PA-C  acetaminophen (TYLENOL) 325 MG tablet Take 2 tablets (650 mg total) by mouth every 4 (four) hours as needed (for pain scale < 4  OR  temperature  >/=  100.5 F). 12/07/17   Juliene Pina, CNM  benzocaine-Menthol (DERMOPLAST) 20-0.5 % AERO Apply 1 application topically as needed for irritation (perineal discomfort). 12/07/17   Juliene Pina, CNM  coconut oil OIL Apply 1 application topically as needed. 12/07/17   Juliene Pina, CNM   cyclobenzaprine (FLEXERIL) 10 MG tablet Take 1 tablet (10 mg total) by mouth 3 (three) times daily as needed for muscle spasms. 03/29/22   Apolonio Schneiders, FNP  cyclobenzaprine (FLEXERIL) 10 MG tablet Take 1 tablet (10 mg total) by mouth 3 (three) times daily as needed for muscle spasms. 07/24/22   Hassell Done, Mary-Margaret, FNP  lidocaine (LIDODERM) 5 % Place 1 patch onto the skin daily. Remove & Discard patch within 12 hours or as directed by MD 03/29/21   Redwine, [place] A, PA-C  methocarbamol (ROBAXIN) 500 MG tablet Take 1 tablet (500 mg total) by mouth 4 (four) times daily for 7 days. 07/30/22 08/06/22  Tempie Hoist, FNP  methylPREDNISolone (MEDROL DOSEPAK) 4 MG TBPK tablet 6 day taper; take as directed on package instructions 07/26/22   Mar Daring, PA-C  naproxen (NAPROSYN) 500 MG tablet Take 1 tablet (500 mg total) by mouth 2 (two) times daily with a meal. 07/24/22   Hassell Done, Mary-Margaret, FNP  ondansetron (ZOFRAN-ODT) 4 MG disintegrating tablet Take 1 tablet (4 mg total) by mouth every 8 (eight) hours as needed for nausea or vomiting. 09/28/21   Brunetta Jeans, PA-C  Prenatal Vit-Fe Fumarate-FA (PRENATAL MULTIVITAMIN) TABS tablet Take 1 tablet by mouth daily at 12 noon.    [provider]  tizanidine (ZANAFLEX) 2 MG capsule Take 1 capsule (2 mg total) by  mouth 3 (three) times daily. 06/14/21   Wurst, [place], PA-C      Allergies    Patient has no known allergies.    Review of Systems   Review of Systems  Musculoskeletal:  Positive for arthralgias and neck pain.  All other systems reviewed and are negative.   Physical Exam Updated Vital Signs BP 114/80   Pulse (!) 114   Temp 98 F (36.7 C)   Resp 18   Ht 6' (1.829 m)   Wt 95.3 kg   LMP 07/27/2022 (Exact Date)   SpO2 100%   BMI 28.48 kg/m   Physical Exam Vitals and nursing note reviewed.  Constitutional:      Appearance: She is well-developed.  HENT:     Head: Normocephalic and atraumatic.  Eyes:      Conjunctiva/sclera: Conjunctivae normal.     Pupils: Pupils are equal, round, and reactive to light.  Cardiovascular:     Rate and Rhythm: Normal rate and regular rhythm.     Heart sounds: Normal heart sounds.  Pulmonary:     Effort: Pulmonary effort is normal.     Breath sounds: Normal breath sounds.  Abdominal:     General: Bowel sounds are normal.     Palpations: Abdomen is soft.  Musculoskeletal:        General: Normal range of motion.     Cervical back: Normal range of motion.     Comments: No midline step-off or deformity of the cervical or thoracic spine, pain elicited with ROM left shoulder, full ROM at elbow/wrist/fingers without issue, arm is NVI  Skin:    General: Skin is warm and dry.  Neurological:     Mental Status: She is alert and oriented to person, place, and time.     ED Results / Procedures / Treatments   Labs (all labs ordered are listed, but only abnormal results are displayed) Labs Reviewed - No data to display  EKG None  Radiology CT Cervical Spine Wo Contrast  Result Date: 07/31/2022 CLINICAL DATA:  Acute neck pain EXAM: CT CERVICAL SPINE WITHOUT CONTRAST TECHNIQUE: Multidetector CT imaging of the cervical spine was performed without intravenous contrast. Multiplanar CT image reconstructions were also generated. RADIATION DOSE REDUCTION: This exam was performed according to the departmental dose-optimization program which includes automated exposure control, adjustment of the mA and/or kV according to patient size and/or use of iterative reconstruction technique. COMPARISON:  None Available. FINDINGS: Alignment: No static subluxation. Facets are aligned. Occipital condyles and the lateral masses of C1 and C2 are normally approximated. Skull base and vertebrae: No acute fracture. Soft tissues and spinal canal: No prevertebral fluid or swelling. No visible canal hematoma. Disc levels: No advanced spinal canal or neural foraminal stenosis. Upper chest: No  pneumothorax, pulmonary nodule or pleural effusion. Other: Normal visualized paraspinal cervical soft tissues. IMPRESSION: No acute fracture or static subluxation of the cervical spine. Electronically Signed   By: Ulyses Jarred M.D.   On: 07/31/2022 01:42    Procedures Procedures    Medications Ordered in ED Medications  oxyCODONE-acetaminophen (PERCOCET/ROXICET) 5-325 MG per tablet 1 tablet (1 tablet Oral Given 07/31/22 0225)  predniSONE (DELTASONE) tablet 60 mg (60 mg Oral Given 07/31/22 0225)    ED Course/ Medical Decision Making/ A&P                             Medical Decision Making Amount and/or Complexity of Data Reviewed Radiology: ordered  and independent interpretation performed.  Risk Prescription drug management.   32 year old female presenting to the ED with neck and left arm pain.  Has been having issues over the past week.  Pain begins in neck and radiates down entire left arm with some associated tingling.  She has not had any focal numbness or weakness.  She has no acute midline deformity or step-off noted on exam.  She is able to range the left arm but with some elicited pain.  Her arm is neurovascularly intact.  CT of the neck obtained without any acute findings.  Symptoms still seem more consistent with a cervical radiculopathy.  She has no red flag symptoms or focal neurologic deficits to suggest central cord syndrome or similar.  Previously she did well with prednisone so we will restart prednisone taper, short supply pain medication given.  She was encouraged to follow-up with primary care doctor.  Work note provided.  She can return here for any new or acute changes.  Final Clinical Impression(s) / ED Diagnoses Final diagnoses:  Cervical radiculopathy    Rx / DC Orders ED Discharge Orders          Ordered    oxyCODONE-acetaminophen (PERCOCET) 5-325 MG tablet  Every 4 hours PRN        07/31/22 0230    predniSONE (DELTASONE) 20 MG tablet        07/31/22 0230               Larene Pickett, PA-C 07/31/22 0235    Palumbo, April, MD 07/31/22 [phone]

## 2022-08-04 DIAGNOSIS — M9906 Segmental and somatic dysfunction of lower extremity: Secondary | ICD-10-CM | POA: Diagnosis not present

## 2022-08-04 DIAGNOSIS — M9905 Segmental and somatic dysfunction of pelvic region: Secondary | ICD-10-CM | POA: Diagnosis not present

## 2022-08-04 DIAGNOSIS — M5386 Other specified dorsopathies, lumbar region: Secondary | ICD-10-CM | POA: Diagnosis not present

## 2022-08-04 DIAGNOSIS — M9903 Segmental and somatic dysfunction of lumbar region: Secondary | ICD-10-CM | POA: Diagnosis not present

## 2022-08-05 ENCOUNTER — Other Ambulatory Visit: Payer: Self-pay | Admitting: Nurse Practitioner

## 2022-08-05 ENCOUNTER — Telehealth: Payer: Medicaid Other | Admitting: Nurse Practitioner

## 2022-08-05 ENCOUNTER — Telehealth: Payer: Medicaid Other | Admitting: Physician Assistant

## 2022-08-05 DIAGNOSIS — M549 Dorsalgia, unspecified: Secondary | ICD-10-CM

## 2022-08-05 DIAGNOSIS — M9903 Segmental and somatic dysfunction of lumbar region: Secondary | ICD-10-CM | POA: Diagnosis not present

## 2022-08-05 DIAGNOSIS — M62838 Other muscle spasm: Secondary | ICD-10-CM | POA: Diagnosis not present

## 2022-08-05 DIAGNOSIS — M9906 Segmental and somatic dysfunction of lower extremity: Secondary | ICD-10-CM | POA: Diagnosis not present

## 2022-08-05 DIAGNOSIS — M5412 Radiculopathy, cervical region: Secondary | ICD-10-CM

## 2022-08-05 DIAGNOSIS — M542 Cervicalgia: Secondary | ICD-10-CM | POA: Diagnosis not present

## 2022-08-05 DIAGNOSIS — M9905 Segmental and somatic dysfunction of pelvic region: Secondary | ICD-10-CM | POA: Diagnosis not present

## 2022-08-05 DIAGNOSIS — M5386 Other specified dorsopathies, lumbar region: Secondary | ICD-10-CM | POA: Diagnosis not present

## 2022-08-05 NOTE — Progress Notes (Signed)
Because this is an ongoing issue with multiple recent visits with [place] and with the ER, and giving continued symptoms, I feel your condition warrants further evaluation and I recommend that you be seen in a face to face visit.   NOTE: There will be NO CHARGE for this eVisit   If you are having a true medical emergency please call 911.      For an urgent face to face visit, [facility] has eight urgent care centers for your convenience:   NEW!! [facility] [place] [facility] [place] Get Driving Directions [phone] [address] [place], [place] [facility] [place] [facility] [place] Get Driving Directions [id number] [place], [place] [address]   [facility]) Get Driving Directions [phone] [address], [place] [address]  [facility] [place] [facility]) Get Driving Directions [id number] [address] [place],  [place]  [address]  [facility] [address] [place] Get Driving Directions  [phone] [address] [place],  [place] [address]   [facility] Get Driving Directions [id number] [place], [place], [place] [address]   [facility] [place] Get Driving Directions  [id number] [address] [place], [place] [address]   [facility] [place] Get Driving Directions [id number] [address], [place], [place] [address]  Your MyChart E-visit questionnaire answers were reviewed by a board certified advanced clinical practitioner to complete your personal care plan based on your specific symptoms.  Thank you for using e-Visits.

## 2022-08-05 NOTE — Patient Instructions (Signed)
  NEW!! [facility] [place] Get Driving Directions [phone] [address] [place], [address]    [facility] [place] Get Driving Directions [phone] [address] [place], [place] [address]   [facility]) Get Driving Directions [phone] [address] [place], [place] [address]  [facility] [facility] [place]) Get Driving Directions [phone] [address] [place],  [place]  [address]  [facility] [facility] [place] Get Driving Directions  [phone] [address] [place],  [place] [address]   [facility] Get Driving Directions [phone] [address] [place], [place] [address]   [facility] [place] Get Driving Directions  [phone] [address] [place], [place] [address]   [facility] [place] Get Driving Directions [phone] [address] [place], [place] [address]  Your MyChart E-visit questionnaire answers were reviewed by a board certified advanced clinical practitioner to complete your personal care plan based on your specific symptoms.  Thank you for using e-Visits.    

## 2022-08-05 NOTE — Progress Notes (Signed)
Virtual Visit Consent   Ruth Allen, you are scheduled for a virtual visit with a [facility] provider today. Just as with appointments in the office, your consent must be obtained to participate. Your consent will be active for this visit and any virtual visit you may have with one of our providers in the next 365 days. If you have a MyChart account, a copy of this consent can be sent to you electronically.  As this is a virtual visit, video technology does not allow for your provider to perform a traditional examination. This may limit your provider's ability to fully assess your condition. If your provider identifies any concerns that need to be evaluated in person or the need to arrange testing (such as labs, EKG, etc.), we will make arrangements to do so. Although advances in technology are sophisticated, we cannot ensure that it will always work on either your end or our end. If the connection with a video visit is poor, the visit may have to be switched to a telephone visit. With either a video or telephone visit, we are not always able to ensure that we have a secure connection.  By engaging in this virtual visit, you consent to the provision of healthcare and authorize for your insurance to be billed (if applicable) for the services provided during this visit. Depending on your insurance coverage, you may receive a charge related to this service.  I need to obtain your verbal consent now. Are you willing to proceed with your visit today? Ruth Allen has provided verbal consent on 08/05/2022 for a virtual visit (video or telephone). Ruth Schneiders, FNP  Date: 08/05/2022 4:04 PM  Virtual Visit via Video Note   I, Ruth Allen, connected with  Ruth Allen  ([id number], October 24, 1990) on 08/05/22 at  4:00 PM EDT by a video-enabled telemedicine application and verified that I am speaking with the correct person using two identifiers.  Location: Patient: Virtual Visit Location Patient: Home Provider:  Virtual Visit Location Provider: Home Office   I discussed the limitations of evaluation and management by telemedicine and the availability of in person appointments. The patient expressed understanding and agreed to proceed.    History of Present Illness: Ruth Allen is a 32 y.o. who identifies as a female who was assigned female at birth, and is being seen today for ongoing neck pain.  This is causing muscle spasms into her arm.  She had an appointment with an orthopedic MD today but she was unable to drive so she did not go   She has also been to her Chiropractor that did not provide relief   She was in the ED 07/30/22 and was given Percocet at that time. She prefers not to take this, feels she may be allergic and "doesn't like how it makes her feel"  She has tingling into her arm with a burning sensation  She is actively moving her arm as instructed by her chiropractor   She has an appointment tomorrow with [facility]  In the past month she has had:  She has been on Medrol Dosepak and Prednisone  Has been given Flexeril  Also has been given Robaxin  Rx for Percocet   Problems:  Patient Active Problem List   Diagnosis Date Noted   SVD 7/23 12/06/2017   Postpartum care following vaginal delivery 12/06/2017    Allergies: No Known Allergies Medications:  Current Outpatient Medications:    acetaminophen (TYLENOL) 325 MG tablet, Take 2 tablets (650 mg total)  by mouth every 4 (four) hours as needed (for pain scale < 4  OR  temperature  >/=  100.5 F)., Disp: , Rfl:    benzocaine-Menthol (DERMOPLAST) 20-0.5 % AERO, Apply 1 application topically as needed for irritation (perineal discomfort)., Disp: , Rfl:    coconut oil OIL, Apply 1 application topically as needed., Disp: , Rfl: 0   cyclobenzaprine (FLEXERIL) 10 MG tablet, Take 1 tablet (10 mg total) by mouth 3 (three) times daily as needed for muscle spasms., Disp: 30 tablet, Rfl: 0   cyclobenzaprine (FLEXERIL) 10 MG tablet, Take 1  tablet (10 mg total) by mouth 3 (three) times daily as needed for muscle spasms., Disp: 30 tablet, Rfl: 0   lidocaine (LIDODERM) 5 %, Place 1 patch onto the skin daily. Remove & Discard patch within 12 hours or as directed by MD, Disp: 30 patch, Rfl: 0   methocarbamol (ROBAXIN) 500 MG tablet, Take 1 tablet (500 mg total) by mouth 4 (four) times daily for 7 days., Disp: 28 tablet, Rfl: 0   methylPREDNISolone (MEDROL DOSEPAK) 4 MG TBPK tablet, 6 day taper; take as directed on package instructions, Disp: 21 tablet, Rfl: 0   naproxen (NAPROSYN) 500 MG tablet, Take 1 tablet (500 mg total) by mouth 2 (two) times daily with a meal., Disp: 30 tablet, Rfl: 0   ondansetron (ZOFRAN-ODT) 4 MG disintegrating tablet, Take 1 tablet (4 mg total) by mouth every 8 (eight) hours as needed for nausea or vomiting., Disp: 20 tablet, Rfl: 0   oxyCODONE-acetaminophen (PERCOCET) 5-325 MG tablet, Take 1 tablet by mouth every 4 (four) hours as needed., Disp: 20 tablet, Rfl: 0   predniSONE (DELTASONE) 20 MG tablet, Take 40 mg by mouth daily for 3 days, then 20mg  by mouth daily for 3 days, then 10mg  daily for 3 days, Disp: 12 tablet, Rfl: 0   Prenatal Vit-Fe Fumarate-FA (PRENATAL MULTIVITAMIN) TABS tablet, Take 1 tablet by mouth daily at 12 noon., Disp: , Rfl:    tizanidine (ZANAFLEX) 2 MG capsule, Take 1 capsule (2 mg total) by mouth 3 (three) times daily., Disp: 15 capsule, Rfl: 0  Observations/Objective: Patient is well-developed, well-nourished in no acute distress.  Resting comfortably  at home.  Head is normocephalic, atraumatic.  No labored breathing.  Speech is clear and coherent with logical content.  Patient is alert and oriented at baseline.    Assessment and Plan: 1. Muscle spasm Recommended walk in at [facility] or UC tonight for evaluation  Discussed limitation of virtual care Patient is agreeable to plan      Follow Up Instructions: I discussed the assessment and treatment plan with the patient. The  patient was provided an opportunity to ask questions and all were answered. The patient agreed with the plan and demonstrated an understanding of the instructions.  A copy of instructions were sent to the patient via MyChart unless otherwise noted below.    The patient was advised to call back or seek an in-person evaluation if the symptoms worsen or if the condition fails to improve as anticipated.  Time:  I spent 15 minutes with the patient via telehealth technology discussing the above problems/concerns.    Ruth Schneiders, FNP

## 2022-09-09 ENCOUNTER — Other Ambulatory Visit: Payer: Self-pay | Admitting: Physician Assistant

## 2022-09-09 ENCOUNTER — Telehealth: Payer: Medicaid Other | Admitting: Physician Assistant

## 2022-09-09 DIAGNOSIS — M549 Dorsalgia, unspecified: Secondary | ICD-10-CM

## 2022-09-09 NOTE — Progress Notes (Signed)
Because of severity and duration of pain, you will need to be seen in person for a face-to-face assessment including exam and possible further workup.    NOTE: There will be NO CHARGE for this eVisit   If you are having a true medical emergency please call 911.      For an urgent face to face visit, [facility] has eight urgent care centers for your convenience:   NEW!! [facility] at [facility] Get Driving Directions [phone] [address] [place], [address]    [facility] Health Urgent Care Center at [facility] Get Driving Directions [phone] [address] [place], [place] [address]   [facility] Health Urgent Care Center [facility]) Get Driving Directions [phone] [address] [place], [place] [address]  [facility] Health Urgent Care Center [facility]) Get Driving Directions [phone] [address] [place],  [place]  [address]  [facility] Health Urgent Care Center [facility] - at [facility]  [phone] [address] [place],  [place] [address]   [facility] Health Urgent Care at [facility] Get Driving Directions [phone] [address] [place], [place] [address]   [facility] Health Urgent Care at [facility] Get Driving Directions  [phone] [address] [place], [place] [address]   [facility] Health Urgent Care at [facility] Directions [phone] [address], [address] [place], [place] [address]  Your MyChart E-visit questionnaire answers were reviewed by a board certified advanced clinical practitioner to complete your personal care plan based on your specific symptoms.  Thank you for using e-Visits.

## 2022-09-25 ENCOUNTER — Other Ambulatory Visit: Payer: Self-pay

## 2022-09-25 ENCOUNTER — Emergency Department (HOSPITAL_COMMUNITY)
Admission: EM | Admit: 2022-09-25 | Discharge: 2022-09-26 | Discharge status: Against Medical Advice | Payer: Medicaid Other | Attending: Emergency Medicine | Admitting: Emergency Medicine

## 2022-09-25 ENCOUNTER — Encounter (HOSPITAL_COMMUNITY): Payer: Self-pay | Admitting: *Deleted

## 2022-09-25 DIAGNOSIS — M436 Torticollis: Secondary | ICD-10-CM | POA: Insufficient documentation

## 2022-09-25 DIAGNOSIS — Z5321 Procedure and treatment not carried out due to patient leaving prior to being seen by health care provider: Secondary | ICD-10-CM | POA: Insufficient documentation

## 2022-09-25 DIAGNOSIS — M542 Cervicalgia: Secondary | ICD-10-CM | POA: Diagnosis present

## 2022-09-25 NOTE — ED Triage Notes (Signed)
The pt has had neck pain for 2 months and she has not been able to rest.   Known disc problem in her neck  lmp  may 5

## 2022-09-27 ENCOUNTER — Emergency Department (HOSPITAL_COMMUNITY)
Admission: EM | Admit: 2022-09-27 | Discharge: 2022-09-27 | Disposition: A | Discharge status: Home / Self Care | Payer: Medicaid Other | Attending: Emergency Medicine | Admitting: Emergency Medicine

## 2022-09-27 ENCOUNTER — Other Ambulatory Visit: Payer: Self-pay

## 2022-09-27 ENCOUNTER — Encounter (HOSPITAL_COMMUNITY): Payer: Self-pay

## 2022-09-27 DIAGNOSIS — M5412 Radiculopathy, cervical region: Secondary | ICD-10-CM

## 2022-09-27 DIAGNOSIS — M542 Cervicalgia: Secondary | ICD-10-CM | POA: Diagnosis present

## 2022-09-27 MED ORDER — LIDOCAINE 5 % EX PTCH
1.0000 | MEDICATED_PATCH | Freq: Once | CUTANEOUS | Status: DC
  Administered 2022-09-27: 1 via TRANSDERMAL
  Filled 2022-09-27: qty 1

## 2022-09-27 MED ORDER — PREDNISONE 20 MG PO TABS
60.0000 mg | ORAL_TABLET | Freq: Once | ORAL | Status: AC
  Administered 2022-09-27: 60 mg via ORAL
  Filled 2022-09-27: qty 3

## 2022-09-27 MED ORDER — LIDOCAINE 4 % EX PTCH: 1.0000 | MEDICATED_PATCH | CUTANEOUS | 0 refills | Status: AC

## 2022-09-27 MED ORDER — KETOROLAC TROMETHAMINE 30 MG/ML IJ SOLN
30.0000 mg | Freq: Once | INTRAMUSCULAR | Status: AC
  Administered 2022-09-27: 30 mg via INTRAMUSCULAR
  Filled 2022-09-27: qty 1

## 2022-09-27 MED ORDER — LIDOCAINE 5 % EX PTCH: 1.0000 | MEDICATED_PATCH | CUTANEOUS | 0 refills | Status: DC

## 2022-09-27 MED ORDER — PREDNISONE 20 MG PO TABS: ORAL_TABLET | ORAL | 0 refills | Status: AC

## 2022-09-27 NOTE — ED Provider Notes (Signed)
[place] EMERGENCY DEPARTMENT AT [facility] Provider Note   CSN: [id number] Arrival date & time: 09/27/22  8657     History  Chief Complaint  Patient presents with   Neck Pain   Back Pain    Ruth Allen is a 32 y.o. female.  HPI 32 year old female presents with left-sided neck pain and arm pain.  Overall she has had a couple months of the symptoms.  No trauma or specific injury that she can remember.  She has been dealing with pain near her scapula as well as pain going down her left arm.  Symptoms seem to be getting worse.  It is hard for her to lift things with the left arm, mostly due to pain.  There is some tingling in her left arm as well as pain radiating down her entire left arm.  She has been following with [facility] and doing physical therapy and they are due to get an outpatient MRI although she questions whether or not this has truly been ordered.  She has been taking Tylenol, Flexeril, and pregabalin.  She was told not to take NSAIDs but she is not sure why.  Other medications have been causing her tongue to have spots/itch and she thinks she was allergic to the oxycodone she was prescribed.  No incontinence or leg weakness/numbness.  No history of HIV, drug abuse.  No fevers.  When asked what has helped, the only thing that seems to have at least partially helped is the steroids she has been prescribed.  Home Medications Prior to Admission medications   Medication Sig Start Date End Date Taking? Authorizing Provider  acetaminophen (TYLENOL) 325 MG tablet Take 2 tablets (650 mg total) by mouth every 4 (four) hours as needed (for pain scale < 4  OR  temperature  >/=  100.5 F). 12/07/17   Neta Mends, CNM  benzocaine-Menthol (DERMOPLAST) 20-0.5 % AERO Apply 1 application topically as needed for irritation (perineal discomfort). 12/07/17   Neta Mends, CNM  coconut oil OIL Apply 1 application topically as needed. 12/07/17   Neta Mends, CNM  cyclobenzaprine  (FLEXERIL) 10 MG tablet Take 1 tablet (10 mg total) by mouth 3 (three) times daily as needed for muscle spasms. 03/29/22   Viviano Simas, FNP  cyclobenzaprine (FLEXERIL) 10 MG tablet Take 1 tablet (10 mg total) by mouth 3 (three) times daily as needed for muscle spasms. 07/24/22   Daphine Deutscher, Mary-Margaret, FNP  lidocaine (LIDODERM) 5 % Place 1 patch onto the skin daily. Remove & Discard patch within 12 hours or as directed by MD 03/29/21   Redwine, [place] A, PA-C  methylPREDNISolone (MEDROL DOSEPAK) 4 MG TBPK tablet 6 day taper; take as directed on package instructions 07/26/22   [name], PA-C  naproxen (NAPROSYN) 500 MG tablet Take 1 tablet (500 mg total) by mouth 2 (two) times daily with a meal. 07/24/22   Daphine Deutscher, Mary-Margaret, FNP  ondansetron (ZOFRAN-ODT) 4 MG disintegrating tablet Take 1 tablet (4 mg total) by mouth every 8 (eight) hours as needed for nausea or vomiting. 09/28/21   Waldon Merl, PA-C  oxyCODONE-acetaminophen (PERCOCET) 5-325 MG tablet Take 1 tablet by mouth every 4 (four) hours as needed. 07/31/22   Garlon Hatchet, PA-C  predniSONE (DELTASONE) 20 MG tablet Take 40 mg by mouth daily for 3 days, then 20mg  by mouth daily for 3 days, then 10mg  daily for 3 days 07/31/22   Garlon Hatchet, PA-C  Prenatal Vit-Fe Fumarate-FA (PRENATAL  MULTIVITAMIN) TABS tablet Take 1 tablet by mouth daily at 12 noon.    [provider]  tizanidine (ZANAFLEX) 2 MG capsule Take 1 capsule (2 mg total) by mouth 3 (three) times daily. 06/14/21   Wurst, [place], PA-C      Allergies    Oxycodone    Review of Systems   Review of Systems  Respiratory:  Negative for shortness of breath.   Cardiovascular:  Negative for chest pain.  Musculoskeletal:  Positive for back pain and neck pain.  Neurological:  Positive for numbness (tlingling). Negative for weakness.    Physical Exam Updated Vital Signs BP 125/82 (BP Location: Right Arm)   Pulse 98   Temp 98.3 F (36.8 C) (Oral)   Resp 17    LMP 09/20/2022   SpO2 100%  Physical Exam Vitals and nursing note reviewed.  Constitutional:      Appearance: She is well-developed.  HENT:     Head: Normocephalic and atraumatic.  Cardiovascular:     Rate and Rhythm: Normal rate and regular rhythm.     Pulses:          Radial pulses are 2+ on the left side.     Heart sounds: Normal heart sounds.  Pulmonary:     Effort: Pulmonary effort is normal.     Breath sounds: Normal breath sounds.  Abdominal:     Palpations: Abdomen is soft.     Tenderness: There is no abdominal tenderness.  Musculoskeletal:       Arms:     Cervical back: No rigidity.  Skin:    General: Skin is warm and dry.  Neurological:     Mental Status: She is alert.     Comments: 5/5 strength in all 4 extremities.  Grossly normal sensation including the left upper extremity.  The left upper extremity has some giveaway weakness due to pain but when tested in smaller increments she has intact strength.     ED Results / Procedures / Treatments   Labs (all labs ordered are listed, but only abnormal results are displayed) Labs Reviewed - No data to display  EKG None  Radiology No results found.  Procedures Procedures    Medications Ordered in ED Medications  ketorolac (TORADOL) 30 MG/ML injection 30 mg (has no administration in time range)  predniSONE (DELTASONE) tablet 60 mg (has no administration in time range)  lidocaine (LIDODERM) 5 % 1 patch (has no administration in time range)    ED Course/ Medical Decision Making/ A&P                             Medical Decision Making Risk Prescription drug management.   Chart reviewed.  Patient has been dealing with this pain for months.  While she would like a more urgent MRI, unfortunately she does not meet any emergency criteria to get 1.  I have very low suspicion that she is having a spinal cord emergency.  I do think she has radiculopathy and if the prednisone has helped in the past for a few  days, will give her a longer 2-week taper.  Will also give a dose of Toradol.  I am not sure why she was told not to take NSAIDs but I do not see an obvious reason and so I would recommend she take some ibuprofen or Advil.  She may still take Tylenol as well.  She has also been asking for something topical and  was given a lidoderm patch and prescription.  Discussed the importance of following up with her orthopedist and we will give return precautions.        Final Clinical Impression(s) / ED Diagnoses Final diagnoses:  Cervical radiculopathy    Rx / DC Orders ED Discharge Orders     None         Pricilla Loveless, MD 09/27/22 [phone]

## 2022-09-27 NOTE — Discharge Instructions (Signed)
You were given your first dose of steroid here in the emergency department.  Start the next dose tomorrow, 5/14.  You may take Advil and/or ibuprofen in addition to Tylenol to help with pain.  If you develop weakness in your arm, incontinence of your bowels or bladder, numbness to your buttocks, fever, or any other new/concerning symptoms then return to the ER or call 911.

## 2022-09-27 NOTE — ED Triage Notes (Signed)
Pt reports ongoing pain in her neck and left shoulder. Pt states she is being seen at [facility] but symptoms have not improved and has not had an MRI yet.

## 2022-10-11 ENCOUNTER — Telehealth: Payer: Medicaid Other | Admitting: Physician Assistant

## 2022-10-11 DIAGNOSIS — B370 Candidal stomatitis: Secondary | ICD-10-CM | POA: Diagnosis not present

## 2022-10-11 MED ORDER — FLUCONAZOLE 150 MG PO TABS
150.0000 mg | ORAL_TABLET | ORAL | 0 refills | Status: AC | PRN
Start: 2022-10-11 — End: ?

## 2022-10-11 MED ORDER — NYSTATIN 100000 UNIT/ML MT SUSP
5.0000 mL | Freq: Four times a day (QID) | OROMUCOSAL | 0 refills | Status: AC
Start: 2022-10-11 — End: ?

## 2022-10-11 NOTE — Progress Notes (Signed)
Virtual Visit Consent   Ruth Allen, you are scheduled for a virtual visit with a [facility] provider today. Just as with appointments in the office, your consent must be obtained to participate. Your consent will be active for this visit and any virtual visit you may have with one of our providers in the next 365 days. If you have a MyChart account, a copy of this consent can be sent to you electronically.  As this is a virtual visit, video technology does not allow for your provider to perform a traditional examination. This may limit your provider's ability to fully assess your condition. If your provider identifies any concerns that need to be evaluated in person or the need to arrange testing (such as labs, EKG, etc.), we will make arrangements to do so. Although advances in technology are sophisticated, we cannot ensure that it will always work on either your end or our end. If the connection with a video visit is poor, the visit may have to be switched to a telephone visit. With either a video or telephone visit, we are not always able to ensure that we have a secure connection.  By engaging in this virtual visit, you consent to the provision of healthcare and authorize for your insurance to be billed (if applicable) for the services provided during this visit. Depending on your insurance coverage, you may receive a charge related to this service.  I need to obtain your verbal consent now. Are you willing to proceed with your visit today? Ruth Allen has provided verbal consent on 10/11/2022 for a virtual visit (video or telephone). Ruth Loveless, PA-C  Date: 10/11/2022 3:40 PM  Virtual Visit via Video Note   IMargaretann Allen, connected with  Ruth Allen  ([id number], 1990/12/01) on 10/11/22 at  3:30 PM EDT by a video-enabled telemedicine application and verified that I am speaking with the correct person using two identifiers.  Location: Patient: Virtual Visit Location Patient:  Home Provider: Virtual Visit Location Provider: Home Office   I discussed the limitations of evaluation and management by telemedicine and the availability of in person appointments. The patient expressed understanding and agreed to proceed.    History of Present Illness: Ruth Allen is a 32 y.o. who identifies as a female who was assigned female at birth, and is being seen today for tongue irritation and a white spot on her tongue. Over the past 2 months has been on treatment for cervical radiculopathy. This included a few rounds of steroids. About 2 weeks ago she was placed on her last round of steroids, but noticed she was having some tongue irritation. Her neck had improved so she stopped the steroids. She has continued to have irritation and rawness of her tongue to the point she feels it is affecting her taste. Yesterday started feeling like she had a coating on her tongue without seeing one. Then today noticed a white streak in the middle of her tongue near the tip. She has tried salt water gargles and Vit E capsules without relief.    Problems:  Patient Active Problem List   Diagnosis Date Noted   SVD 7/23 12/06/2017   Postpartum care following vaginal delivery 12/06/2017    Allergies:  Allergies  Allergen Reactions   Oxycodone Other (See Comments)    Burning sensation to tongue   Medications:  Current Outpatient Medications:    fluconazole (DIFLUCAN) 150 MG tablet, Take 1 tablet (150 mg total) by mouth every 3 (three) days  as needed., Disp: 2 tablet, Rfl: 0   nystatin (MYCOSTATIN) 100000 UNIT/ML suspension, Take 5 mLs (500,000 Units total) by mouth 4 (four) times daily., Disp: 120 mL, Rfl: 0   acetaminophen (TYLENOL) 325 MG tablet, Take 2 tablets (650 mg total) by mouth every 4 (four) hours as needed (for pain scale < 4  OR  temperature  >/=  100.5 F)., Disp: , Rfl:    benzocaine-Menthol (DERMOPLAST) 20-0.5 % AERO, Apply 1 application topically as needed for irritation (perineal  discomfort)., Disp: , Rfl:    coconut oil OIL, Apply 1 application topically as needed., Disp: , Rfl: 0   cyclobenzaprine (FLEXERIL) 10 MG tablet, Take 1 tablet (10 mg total) by mouth 3 (three) times daily as needed for muscle spasms., Disp: 30 tablet, Rfl: 0   cyclobenzaprine (FLEXERIL) 10 MG tablet, Take 1 tablet (10 mg total) by mouth 3 (three) times daily as needed for muscle spasms., Disp: 30 tablet, Rfl: 0   lidocaine 4 %, Place 1 patch onto the skin daily., Disp: 30 patch, Rfl: 0   naproxen (NAPROSYN) 500 MG tablet, Take 1 tablet (500 mg total) by mouth 2 (two) times daily with a meal., Disp: 30 tablet, Rfl: 0   ondansetron (ZOFRAN-ODT) 4 MG disintegrating tablet, Take 1 tablet (4 mg total) by mouth every 8 (eight) hours as needed for nausea or vomiting., Disp: 20 tablet, Rfl: 0   predniSONE (DELTASONE) 20 MG tablet, 3 tabs po daily x 2 days, then 2 tabs x 3 days, then 1.5 tabs x 3 days, then 1 tab x 3 days, then 0.5 tabs x 3 days, Disp: 21 tablet, Rfl: 0   Prenatal Vit-Fe Fumarate-FA (PRENATAL MULTIVITAMIN) TABS tablet, Take 1 tablet by mouth daily at 12 noon., Disp: , Rfl:   Observations/Objective: Patient is well-developed, well-nourished in no acute distress.  Resting comfortably at home.  Head is normocephalic, atraumatic.  No labored breathing.  Speech is clear and coherent with logical content.  Patient is alert and oriented at baseline.    Assessment and Plan: 1. Thrush - fluconazole (DIFLUCAN) 150 MG tablet; Take 1 tablet (150 mg total) by mouth every 3 (three) days as needed.  Dispense: 2 tablet; Refill: 0 - nystatin (MYCOSTATIN) 100000 UNIT/ML suspension; Take 5 mLs (500,000 Units total) by mouth 4 (four) times daily.  Dispense: 120 mL; Refill: 0  - Suspect thrush from recent steroids and that symptoms started with steroid use.  - Nystatin and Diflucan given - Can continue salt water gargles - Tylenol for pain if needed - Oral lidocaine for pain if needed - Seek in  person evaluation if symptoms persist or worsen  Follow Up Instructions: I discussed the assessment and treatment plan with the patient. The patient was provided an opportunity to ask questions and all were answered. The patient agreed with the plan and demonstrated an understanding of the instructions.  A copy of instructions were sent to the patient via MyChart unless otherwise noted below.    The patient was advised to call back or seek an in-person evaluation if the symptoms worsen or if the condition fails to improve as anticipated.  Time:  I spent 10 minutes with the patient via telehealth technology discussing the above problems/concerns.    Ruth Loveless, PA-C

## 2022-10-11 NOTE — Patient Instructions (Signed)
[name], thank you for joining [name], PA-C for today's virtual visit.  While this provider is not your primary care provider (PCP), if your PCP is located in our provider database this encounter information will be shared with them immediately following your visit.   A [facility] MyChart account gives you access to today's visit and all your visits, tests, and labs performed at [facility] " click here if you don't have a [facility] MyChart account or go to [email or web address]  Consent: (Patient) [name] provided verbal consent for this virtual visit at the beginning of the encounter.  Current Medications:  Current Outpatient Medications:    fluconazole (DIFLUCAN) 150 MG tablet, Take 1 tablet (150 mg total) by mouth every 3 (three) days as needed., Disp: 2 tablet, Rfl: 0   nystatin (MYCOSTATIN) 100000 UNIT/ML suspension, Take 5 mLs (500,000 Units total) by mouth 4 (four) times daily., Disp: 120 mL, Rfl: 0   acetaminophen (TYLENOL) 325 MG tablet, Take 2 tablets (650 mg total) by mouth every 4 (four) hours as needed (for pain scale < 4  OR  temperature  >/=  100.5 F)., Disp: , Rfl:    benzocaine-Menthol (DERMOPLAST) 20-0.5 % AERO, Apply 1 application topically as needed for irritation (perineal discomfort)., Disp: , Rfl:    coconut oil OIL, Apply 1 application topically as needed., Disp: , Rfl: 0   cyclobenzaprine (FLEXERIL) 10 MG tablet, Take 1 tablet (10 mg total) by mouth 3 (three) times daily as needed for muscle spasms., Disp: 30 tablet, Rfl: 0   cyclobenzaprine (FLEXERIL) 10 MG tablet, Take 1 tablet (10 mg total) by mouth 3 (three) times daily as needed for muscle spasms., Disp: 30 tablet, Rfl: 0   lidocaine 4 %, Place 1 patch onto the skin daily., Disp: 30 patch, Rfl: 0   naproxen (NAPROSYN) 500 MG tablet, Take 1 tablet (500 mg total) by mouth 2 (two) times daily with a meal., Disp: 30 tablet, Rfl: 0   ondansetron (ZOFRAN-ODT) 4 MG disintegrating  tablet, Take 1 tablet (4 mg total) by mouth every 8 (eight) hours as needed for nausea or vomiting., Disp: 20 tablet, Rfl: 0   predniSONE (DELTASONE) 20 MG tablet, 3 tabs po daily x 2 days, then 2 tabs x 3 days, then 1.5 tabs x 3 days, then 1 tab x 3 days, then 0.5 tabs x 3 days, Disp: 21 tablet, Rfl: 0   Prenatal Vit-Fe Fumarate-FA (PRENATAL MULTIVITAMIN) TABS tablet, Take 1 tablet by mouth daily at 12 noon., Disp: , Rfl:    Medications ordered in this encounter:  Meds ordered this encounter  Medications   fluconazole (DIFLUCAN) 150 MG tablet    Sig: Take 1 tablet (150 mg total) by mouth every 3 (three) days as needed.    Dispense:  2 tablet    Refill:  0    Order Specific Question:   Supervising Provider    Answer:   Merrilee Jansky [id number]   nystatin (MYCOSTATIN) 100000 UNIT/ML suspension    Sig: Take 5 mLs (500,000 Units total) by mouth 4 (four) times daily.    Dispense:  120 mL    Refill:  0    Order Specific Question:   Supervising Provider    Answer:   Merrilee Jansky [[id number]]     *If you need refills on other medications prior to your next appointment, please contact your pharmacy*  Follow-Up: Call back or seek an in-person evaluation if the symptoms worsen or if the  condition fails to improve as anticipated.  [facility] [phone]  Other Instructions Oral Thrush, Adult Oral thrush, also called oral candidiasis, is a fungal infection that develops in the mouth and throat and on the tongue. It causes white patches to form in the mouth and on the tongue. Many cases of thrush are mild, but this infection can also be serious. Ginette Pitman can be a repeated (recurrent) problem for certain people who have a weak body defense system (immune system). The weakness can be caused by chronic illnesses, or by taking medicines that limit the body's ability to fight infection. If a person has difficulty fighting infection, the fungus that causes thrush can spread through  the body. This can cause life-threatening blood or organ infections. What are the causes? This condition is caused by a fungus (yeast) called Candida albicans. This fungus is normally present in small amounts in the mouth and on other mucous membranes. It usually causes no harm. If conditions are present that allow the fungus to grow without control, it invades surrounding tissues and becomes an infection. Other Candida species can also lead to thrush, though this is rare. What increases the risk? The following factors may make you more likely to develop this condition: Having a weakened immune system. Being an older adult. Having diabetes, cancer, or HIV (human immunodeficiency virus). Having dry mouth (xerostomia). Being pregnant or breastfeeding. Having poor dental care, especially in those who have dentures. Using antibiotic or steroid medicines. What are the signs or symptoms? Symptoms of this condition can vary from mild and moderate to severe and persistent. Symptoms may include: A burning feeling in the mouth and throat. This can occur at the start of a thrush infection. White patches that stick to the mouth and tongue. The tissue around the patches may be red, raw, and painful. If rubbed (during tooth brushing, for example), the patches and the tissue of the mouth may bleed easily. A bad taste in the mouth or difficulty tasting foods. A cottony feeling in the mouth. Pain during eating and swallowing. Poor appetite. Cracking at the corners of the mouth. How is this diagnosed? This condition is diagnosed based on: A physical exam. Your medical history. How is this treated? This condition is treated with medicines called antifungals, which prevent the growth of fungi. These medicines are either applied directly to the affected area (topical) or swallowed (oral). The treatment will depend on the severity of the condition. Mild cases of thrush may be treated with an antifungal mouth  rinse or lozenges. Treatment usually lasts about 14 days. Moderate to severe cases of thrush can be treated with oral antifungal medicine, if they have spread to the esophagus. A topical antifungal medicine may also be used. For some severe infections, treatment may need to continue for more than 14 days. Oral antifungal medicines are rarely used during pregnancy because they may be harmful to the unborn child. If you are pregnant, talk with your health care provider about options for treatment. Persistent or recurrent thrush. For cases of thrush that do not go away or keep coming back: Treatment may be needed twice as long as the symptoms last. Treatment will include both oral and topical antifungal medicines. People with a weakened immune system can take an antifungal medicine on a continuous basis to prevent thrush infections. It is important to treat conditions that make a person more likely to get thrush, such as diabetes or HIV. Follow these instructions at home: Relieving soreness and  discomfort To help reduce the discomfort of thrush: Drink cold liquids such as water or iced tea. Try flavored ice treats or frozen juices. Eat foods that are easy to swallow, such as gelatin, ice cream, or custard. Try drinking from a straw if the patches in your mouth are painful.  General instructions Take or use over-the-counter and prescription medicines only as told by your health care provider. Eat plain, unflavored yogurt as directed by your health care provider. Check the label to make sure the yogurt contains live cultures. This yogurt can help healthy bacteria grow in the mouth and can stop the growth of the fungus that causes thrush. If you wear dentures, remove the dentures before going to bed, brush them vigorously, and soak them in a cleaning solution as directed by your health care provider. Rinse your mouth with a warm salt-water mixture several times a day. To make a salt-water mixture,  dissolve -1 tsp (3-6 g) of salt in 1 cup (237 mL) of warm water. Contact a health care provider if: Your symptoms are getting worse or are not improving within 7 days of starting treatment. You have symptoms of a spreading infection, such as white patches on the skin outside of the mouth. You are breastfeeding your baby and you have redness and pain in the nipples. Summary Oral thrush, also called oral candidiasis, is a fungal infection that develops in the mouth and throat and on the tongue. It causes white patches to form in the mouth and on the tongue. You are more likely to get this condition if you have a weakened immune system or an underlying condition, such as HIV, cancer, or diabetes. This condition is treated with medicines called antifungals, which prevent the growth of fungi. Contact a health care provider if your symptoms do not improve, or get worse, within 7 days of starting treatment. This information is not intended to replace advice given to you by your health care provider. Make sure you discuss any questions you have with your health care provider. Document Revised: 04/19/2022 Document Reviewed: 04/19/2022 Elsevier Patient Education  2024 Elsevier Inc.    If you have been instructed to have an in-person evaluation today at a local Urgent Care facility, please use the link below. It will take you to a list of all of our available [facility], including address, phone number and hours of operation. Please do not delay care.  [facility]  If you or a family member do not have a primary care provider, use the link below to schedule a visit and establish care. When you choose a [facility] primary care physician or advanced practice provider, you gain a long-term partner in health. Find a Primary Care Provider  Learn more about [facility] in-office and virtual care options: [facility] Get Care Now

## 2024-01-01 ENCOUNTER — Telehealth: Admitting: Nurse Practitioner

## 2024-01-01 DIAGNOSIS — K0889 Other specified disorders of teeth and supporting structures: Secondary | ICD-10-CM

## 2024-01-01 MED ORDER — PENICILLIN V POTASSIUM 500 MG PO TABS
500.0000 mg | ORAL_TABLET | Freq: Three times a day (TID) | ORAL | 0 refills | Status: AC
Start: 2024-01-01 — End: 2024-01-08

## 2024-01-01 MED ORDER — IBUPROFEN 600 MG PO TABS
600.0000 mg | ORAL_TABLET | Freq: Three times a day (TID) | ORAL | 0 refills | Status: AC | PRN
Start: 2024-01-01 — End: ?

## 2024-01-01 NOTE — Progress Notes (Signed)
 I have spent 5 minutes in review of e-visit questionnaire, review and updating patient chart, medical decision making and response to patient.   Claiborne Rigg, NP

## 2024-01-01 NOTE — Progress Notes (Signed)
 E-Visit for Dental Pain  We are sorry that you are not feeling well.  Here is how we plan to help!  I have prescribed the following:  Pen VK 500mg  3 times a day for 7 days and Ibuprofen  600mg  3 times a day for 7 days for discomfort  It is imperative that you see a dentist within 10 days of this eVisit to determine the cause of the dental pain and be sure it is adequately treated  A toothache or tooth pain is caused when the nerve in the root of a tooth or surrounding a tooth is irritated. Dental (tooth) infection, decay, injury, or loss of a tooth are the most common causes of dental pain. Pain may also occur after an extraction (tooth is pulled out). Pain sometimes originates from other areas and radiates to the jaw, thus appearing to be tooth pain.Bacteria growing inside your mouth can contribute to gum disease and dental decay, both of which can cause pain. A toothache occurs from inflammation of the central portion of the tooth called pulp. The pulp contains nerve endings that are very sensitive to pain. Inflammation to the pulp or pulpitis may be caused by dental cavities, trauma, and infection.    HOME CARE:   For toothaches: Over-the-counter pain medications such as acetaminophen  or ibuprofen  may be used. Take these as directed on the package while you arrange for a dental appointment. Avoid very cold or hot foods, because they may make the pain worse. You may get relief from biting on a cotton ball soaked in oil of cloves. You can get oil of cloves at most drug stores.  For jaw pain:  Aspirin may be helpful for problems in the joint of the jaw in adults. If pain happens every time you open your mouth widely, the temporomandibular joint (TMJ) may be the source of the pain. Yawning or taking a large bite of food may worsen the pain. An appointment with your doctor or dentist will help you find the cause.     GET HELP RIGHT AWAY IF:  You have a high fever or chills If you have had a  recent head or face injury and develop headache, light headedness, nausea, vomiting, or other symptoms that concern you after an injury to your face or mouth, you could have a more serious injury in addition to your dental injury. A facial rash associated with a toothache: This condition may improve with medication. Contact your doctor for them to decide what is appropriate. Any jaw pain occurring with chest pain: Although jaw pain is most commonly caused by dental disease, it is sometimes referred pain from other areas. People with heart disease, especially people who have had stents placed, people with diabetes, or those who have had heart surgery may have jaw pain as a symptom of heart attack or angina. If your jaw or tooth pain is associated with lightheadedness, sweating, or shortness of breath, you should see a doctor as soon as possible. Trouble swallowing or excessive pain or bleeding from gums: If you have a history of a weakened immune system, diabetes, or steroid use, you may be more susceptible to infections. Infections can often be more severe and extensive or caused by unusual organisms. Dental and gum infections in people with these conditions may require more aggressive treatment. An abscess may need draining or IV antibiotics, for example.  MAKE SURE YOU   Understand these instructions. Will watch your condition. Will get help right away if you are  not doing well or get worse.  Thank you for choosing an e-visit.  Your e-visit answers were reviewed by a board certified advanced clinical practitioner to complete your personal care plan. Depending upon the condition, your plan could have included both over the counter or prescription medications.  Please review your pharmacy choice. Make sure the pharmacy is open so you can pick up prescription now. If there is a problem, you may contact your provider through [employer] and have the prescription routed to another pharmacy.  Your  safety is important to us . If you have drug allergies check your prescription carefully.   For the next 24 hours you can use MyChart to ask questions about today's visit, request a non-urgent call back, or ask for a work or school excuse. You will get an email in the next two days asking about your experience. I hope that your e-visit has been valuable and will speed your recovery.

## 2024-01-02 ENCOUNTER — Other Ambulatory Visit: Payer: Self-pay

## 2024-01-02 ENCOUNTER — Encounter (HOSPITAL_COMMUNITY): Payer: Self-pay

## 2024-01-02 ENCOUNTER — Emergency Department (HOSPITAL_COMMUNITY)
Admission: EM | Admit: 2024-01-02 | Discharge: 2024-01-02 | Disposition: A | Discharge status: Home / Self Care | Attending: Emergency Medicine | Admitting: Emergency Medicine

## 2024-01-02 DIAGNOSIS — K0889 Other specified disorders of teeth and supporting structures: Secondary | ICD-10-CM | POA: Diagnosis present

## 2024-01-02 DIAGNOSIS — K029 Dental caries, unspecified: Secondary | ICD-10-CM | POA: Insufficient documentation

## 2024-01-02 MED ORDER — HYDROCODONE-ACETAMINOPHEN 5-325 MG PO TABS: 1.0000 | ORAL_TABLET | Freq: Four times a day (QID) | ORAL | 0 refills | Status: AC | PRN

## 2024-01-02 MED ORDER — HYDROCODONE-ACETAMINOPHEN 5-325 MG PO TABS
1.0000 | ORAL_TABLET | Freq: Once | ORAL | Status: AC
  Administered 2024-01-02: 1 via ORAL
  Filled 2024-01-02: qty 1

## 2024-01-02 MED ORDER — HYDROCODONE-ACETAMINOPHEN 5-325 MG PO TABS: 1.0000 | ORAL_TABLET | Freq: Four times a day (QID) | ORAL | 0 refills | Status: DC | PRN

## 2024-01-02 NOTE — Discharge Instructions (Addendum)
 Continue the ibuprofen  and antibiotics as prescribed.  Follow up with a dentist for further evaluation. Call the office listed and let them know you were referred from the ED

## 2024-01-02 NOTE — ED Triage Notes (Addendum)
 Pt c.o left sided lower dental pain. Pt did telehealth visit yesterday and given rx for penicillin  and ibuprofen  without relief.

## 2024-01-02 NOTE — ED Provider Notes (Signed)
 [facility] EMERGENCY DEPARTMENT AT [facility] Provider Note   CSN: [id number] Arrival date & time: 01/02/24  1447     Patient presents with: Dental Pain   Ruth Allen is a 33 y.o. female.    Dental Pain  Patient presents ED with complaints of a toothache.  Patient states he was having pain in the lower tooth the last several days.  Pain has been increasing in severity.  She has not had any fevers or chills.  No difficulty swallowing.  It is harder to chew.  She had a video visit with the physician and was given prescription for penicillin  and ibuprofen .  Patient states it still hurting and she cannot sleep.    Prior to Admission medications   Medication Sig Start Date End Date Taking? Authorizing Provider  HYDROcodone -acetaminophen  (NORCO/VICODIN) 5-325 MG tablet Take 1 tablet by mouth every 6 (six) hours as needed for severe pain (pain score 7-10). 01/02/24  Yes Randol Simmonds, MD  acetaminophen  (TYLENOL ) 325 MG tablet Take 2 tablets (650 mg total) by mouth every 4 (four) hours as needed (for pain scale < 4  OR  temperature  >/=  100.5 F). 12/07/17   Deward Ismael JAYSON, CNM  benzocaine -Menthol  (DERMOPLAST) 20-0.5 % AERO Apply 1 application topically as needed for irritation (perineal discomfort). 12/07/17   Paul, Daniela C, CNM  coconut oil OIL Apply 1 application topically as needed. 12/07/17   Paul, Daniela C, CNM  cyclobenzaprine  (FLEXERIL ) 10 MG tablet Take 1 tablet (10 mg total) by mouth 3 (three) times daily as needed for muscle spasms. 03/29/22   Kennyth Domino, FNP  cyclobenzaprine  (FLEXERIL ) 10 MG tablet Take 1 tablet (10 mg total) by mouth 3 (three) times daily as needed for muscle spasms. 07/24/22   Gladis Mary-Margaret, FNP  fluconazole  (DIFLUCAN ) 150 MG tablet Take 1 tablet (150 mg total) by mouth every 3 (three) days as needed. 10/11/22   Vivienne Delon HERO, PA-C  ibuprofen  (ADVIL ) 600 MG tablet Take 1 tablet (600 mg total) by mouth every 8 (eight) hours as needed. 01/01/24    Theotis Haze ORN, NP  lidocaine  4 % Place 1 patch onto the skin daily. 09/27/22   Freddi Hamilton, MD  naproxen  (NAPROSYN ) 500 MG tablet Take 1 tablet (500 mg total) by mouth 2 (two) times daily with a meal. 07/24/22   Gladis, Mary-Margaret, FNP  nystatin  (MYCOSTATIN ) 100000 UNIT/ML suspension Take 5 mLs (500,000 Units total) by mouth 4 (four) times daily. 10/11/22   Vivienne Delon HERO, PA-C  ondansetron  (ZOFRAN -ODT) 4 MG disintegrating tablet Take 1 tablet (4 mg total) by mouth every 8 (eight) hours as needed for nausea or vomiting. 09/28/21   Gladis Elsie JAYSON, PA-C  penicillin  v potassium (VEETID) 500 MG tablet Take 1 tablet (500 mg total) by mouth 3 (three) times daily for 7 days. 01/01/24 01/08/24  Fleming, Zelda W, NP  predniSONE  (DELTASONE ) 20 MG tablet 3 tabs po daily x 2 days, then 2 tabs x 3 days, then 1.5 tabs x 3 days, then 1 tab x 3 days, then 0.5 tabs x 3 days 09/27/22   Freddi Hamilton, MD  Prenatal Vit-Fe Fumarate-FA (PRENATAL MULTIVITAMIN) TABS tablet Take 1 tablet by mouth daily at 12 noon.    [provider]    Allergies: Oxycodone     Review of Systems  Updated Vital Signs BP (!) 125/57 (BP Location: Right Arm)   Pulse 63   Temp 98.1 F (36.7 C)   Resp 16   LMP  01/01/2024 (Approximate)   SpO2 100%   Physical Exam Vitals and nursing note reviewed.  Constitutional:      General: She is not in acute distress.    Appearance: She is well-developed.  HENT:     Head: Normocephalic and atraumatic.     Right Ear: External ear normal.     Left Ear: Tympanic membrane and external ear normal.     Nose: No rhinorrhea.     Mouth/Throat:     Mouth: Mucous membranes are moist.     Pharynx: No oropharyngeal exudate or posterior oropharyngeal erythema.     Comments: Dental caries noted left posterior lower molars, no erythema, no purulent drainage Eyes:     General: No scleral icterus.       Right eye: No discharge.        Left eye: No discharge.     Conjunctiva/sclera:  Conjunctivae normal.  Neck:     Trachea: No tracheal deviation.  Cardiovascular:     Rate and Rhythm: Normal rate.  Pulmonary:     Effort: Pulmonary effort is normal. No respiratory distress.     Breath sounds: No stridor.  Abdominal:     General: There is no distension.  Musculoskeletal:        General: No swelling or deformity.     Cervical back: Neck supple.  Skin:    General: Skin is warm and dry.     Findings: No rash.  Neurological:     Mental Status: She is alert. Mental status is at baseline.     Cranial Nerves: No dysarthria or facial asymmetry.     Motor: No seizure activity.     (all labs ordered are listed, but only abnormal results are displayed) Labs Reviewed - No data to display  EKG: None  Radiology: No results found.   Procedures   Medications Ordered in the ED  HYDROcodone -acetaminophen  (NORCO/VICODIN) 5-325 MG per tablet 1 tablet (1 tablet Oral Given 01/02/24 1555)                                    Medical Decision Making Risk Prescription drug management.   Exam is consistent with dental caries and tooth ache associated with that.  Patient does not have any signs of ear infection.  No signs of pharyngitis or peritonsillar abscess.  She does not have any facial swelling to suggest severe infection.  Will have her continue the antibiotic she was prescribed.  Pain medications as needed.  Outpatient follow-up with dentist recommended    Final diagnoses:  Pain, dental    ED Discharge Orders          Ordered    HYDROcodone -acetaminophen  (NORCO/VICODIN) 5-325 MG tablet  Every 6 hours PRN        01/02/24 1554               Randol Simmonds, MD 01/02/24 1558

## 2024-01-08 ENCOUNTER — Telehealth: Admitting: Family

## 2024-01-08 DIAGNOSIS — K0889 Other specified disorders of teeth and supporting structures: Secondary | ICD-10-CM

## 2024-01-08 DIAGNOSIS — K047 Periapical abscess without sinus: Secondary | ICD-10-CM

## 2024-01-08 MED ORDER — CLINDAMYCIN HCL 300 MG PO CAPS
300.0000 mg | ORAL_CAPSULE | Freq: Three times a day (TID) | ORAL | 0 refills | Status: AC
Start: 2024-01-08 — End: 2024-01-15

## 2024-01-08 NOTE — Progress Notes (Signed)
 E-Visit for Dental Pain  We are sorry that you are not feeling well.  Here is how we plan to help!  Based on what you have shared with me in the questionnaire, it sounds like you have dental pain.  Clindamycin  300mg  3 times a day for 7 days.  This is the last antibiotic we can send through and Evisit. You will need to be seen in person for further treatment.   It is imperative that you see a dentist within 10 days of this eVisit to determine the cause of the dental pain and be sure it is adequately treated  A toothache or tooth pain is caused when the nerve in the root of a tooth or surrounding a tooth is irritated. Dental (tooth) infection, decay, injury, or loss of a tooth are the most common causes of dental pain. Pain may also occur after an extraction (tooth is pulled out). Pain sometimes originates from other areas and radiates to the jaw, thus appearing to be tooth pain.Bacteria growing inside your mouth can contribute to gum disease and dental decay, both of which can cause pain. A toothache occurs from inflammation of the central portion of the tooth called pulp. The pulp contains nerve endings that are very sensitive to pain. Inflammation to the pulp or pulpitis may be caused by dental cavities, trauma, and infection.    HOME CARE:   For toothaches: Over-the-counter pain medications such as acetaminophen  or ibuprofen  may be used. Take these as directed on the package while you arrange for a dental appointment. Avoid very cold or hot foods, because they may make the pain worse. You may get relief from biting on a cotton ball soaked in oil of cloves. You can get oil of cloves at most drug stores.  For jaw pain:  Aspirin may be helpful for problems in the joint of the jaw in adults. If pain happens every time you open your mouth widely, the temporomandibular joint (TMJ) may be the source of the pain. Yawning or taking a large bite of food may worsen the pain. An appointment with your  doctor or dentist will help you find the cause.     GET HELP RIGHT AWAY IF:  You have a high fever or chills If you have had a recent head or face injury and develop headache, light headedness, nausea, vomiting, or other symptoms that concern you after an injury to your face or mouth, you could have a more serious injury in addition to your dental injury. A facial rash associated with a toothache: This condition may improve with medication. Contact your doctor for them to decide what is appropriate. Any jaw pain occurring with chest pain: Although jaw pain is most commonly caused by dental disease, it is sometimes referred pain from other areas. People with heart disease, especially people who have had stents placed, people with diabetes, or those who have had heart surgery may have jaw pain as a symptom of heart attack or angina. If your jaw or tooth pain is associated with lightheadedness, sweating, or shortness of breath, you should see a doctor as soon as possible. Trouble swallowing or excessive pain or bleeding from gums: If you have a history of a weakened immune system, diabetes, or steroid use, you may be more susceptible to infections. Infections can often be more severe and extensive or caused by unusual organisms. Dental and gum infections in people with these conditions may require more aggressive treatment. An abscess may need draining or IV  antibiotics, for example.  MAKE SURE YOU   Understand these instructions. Will watch your condition. Will get help right away if you are not doing well or get worse.  Thank you for choosing an e-visit.  Your e-visit answers were reviewed by a board certified advanced clinical practitioner to complete your personal care plan. Depending upon the condition, your plan could have included both over the counter or prescription medications.  Please review your pharmacy choice. Make sure the pharmacy is open so you can pick up prescription now. If  there is a problem, you may contact your provider through [employer] and have the prescription routed to another pharmacy.  Your safety is important to us . If you have drug allergies check your prescription carefully.   For the next 24 hours you can use MyChart to ask questions about today's visit, request a non-urgent call back, or ask for a work or school excuse. You will get an email in the next two days asking about your experience. I hope that your e-visit has been valuable and will speed your recovery.   Approximately 5 minutes was spent documenting and reviewing patient's chart.
